# Patient Record
Sex: Male | Born: 1945 | Race: White | Hispanic: No | Marital: Married | Smoking: Never smoker
Health system: Southern US, Community
[De-identification: ages and names within clinical notes are randomized; demographics above are authoritative.]

## PROBLEM LIST (undated history)

## (undated) DIAGNOSIS — I639 Cerebral infarction, unspecified: Secondary | ICD-10-CM

## (undated) DIAGNOSIS — M109 Gout, unspecified: Secondary | ICD-10-CM

## (undated) DIAGNOSIS — E119 Type 2 diabetes mellitus without complications: Secondary | ICD-10-CM

## (undated) DIAGNOSIS — C61 Malignant neoplasm of prostate: Secondary | ICD-10-CM

## (undated) DIAGNOSIS — F32A Depression, unspecified: Secondary | ICD-10-CM

## (undated) DIAGNOSIS — F1010 Alcohol abuse, uncomplicated: Secondary | ICD-10-CM

## (undated) DIAGNOSIS — F329 Major depressive disorder, single episode, unspecified: Secondary | ICD-10-CM

## (undated) DIAGNOSIS — E785 Hyperlipidemia, unspecified: Secondary | ICD-10-CM

## (undated) DIAGNOSIS — G629 Polyneuropathy, unspecified: Secondary | ICD-10-CM

## (undated) DIAGNOSIS — G4730 Sleep apnea, unspecified: Secondary | ICD-10-CM

## (undated) DIAGNOSIS — I10 Essential (primary) hypertension: Secondary | ICD-10-CM

## (undated) DIAGNOSIS — I2510 Atherosclerotic heart disease of native coronary artery without angina pectoris: Secondary | ICD-10-CM

## (undated) DIAGNOSIS — Z8673 Personal history of transient ischemic attack (TIA), and cerebral infarction without residual deficits: Secondary | ICD-10-CM

## (undated) HISTORY — DX: Type 2 diabetes mellitus without complications: E11.9

## (undated) HISTORY — DX: Cerebral infarction, unspecified: I63.9

## (undated) HISTORY — PX: OTHER SURGICAL HISTORY: SHX169

## (undated) HISTORY — PX: CORONARY ANGIOPLASTY: SHX604

## (undated) HISTORY — PX: APPENDECTOMY: SHX54

## (undated) HISTORY — DX: Polyneuropathy, unspecified: G62.9

## (undated) HISTORY — DX: Depression, unspecified: F32.A

## (undated) HISTORY — DX: Hyperlipidemia, unspecified: E78.5

## (undated) HISTORY — DX: Alcohol abuse, uncomplicated: F10.10

## (undated) HISTORY — PX: PROSTATECTOMY: SHX69

## (undated) HISTORY — DX: Personal history of transient ischemic attack (TIA), and cerebral infarction without residual deficits: Z86.73

## (undated) HISTORY — DX: Atherosclerotic heart disease of native coronary artery without angina pectoris: I25.10

## (undated) HISTORY — PX: TONSILLECTOMY: SUR1361

## (undated) HISTORY — DX: Sleep apnea, unspecified: G47.30

## (undated) HISTORY — PX: CHOLECYSTECTOMY: SHX55

## (undated) HISTORY — PX: KNEE SURGERY: SHX244

## (undated) HISTORY — DX: Gout, unspecified: M10.9

## (undated) HISTORY — PX: CARDIAC CATHETERIZATION: SHX172

## (undated) HISTORY — DX: Major depressive disorder, single episode, unspecified: F32.9

## (undated) HISTORY — DX: Essential (primary) hypertension: I10

## (undated) HISTORY — PX: COLONOSCOPY: SHX174

---

## 2013-06-27 DIAGNOSIS — R079 Chest pain, unspecified: Secondary | ICD-10-CM | POA: Insufficient documentation

## 2016-04-12 LAB — HEMOGLOBIN A1C: HEMOGLOBIN A1C: 9.8

## 2016-09-10 ENCOUNTER — Other Ambulatory Visit: Payer: Self-pay | Admitting: Family Medicine

## 2016-09-10 ENCOUNTER — Ambulatory Visit
Admission: RE | Admit: 2016-09-10 | Discharge: 2016-09-10 | Disposition: A | Discharge status: Home / Self Care | Payer: Medicare Other | Source: Ambulatory Visit | Attending: Family Medicine | Admitting: Family Medicine

## 2016-09-10 DIAGNOSIS — R0602 Shortness of breath: Secondary | ICD-10-CM

## 2016-09-28 ENCOUNTER — Telehealth: Payer: Self-pay | Admitting: Cardiology

## 2016-09-28 NOTE — Telephone Encounter (Signed)
Received records from Dr Derinda Late for appointment on 10/07/16 with Dr Percival Spanish.  Records put with Dr Hochrein's schedule for 10/07/16. lp

## 2016-09-29 ENCOUNTER — Encounter: Payer: Self-pay | Admitting: Neurology

## 2016-09-30 ENCOUNTER — Encounter: Payer: Self-pay | Admitting: Neurology

## 2016-09-30 ENCOUNTER — Telehealth: Payer: Self-pay | Admitting: Neurology

## 2016-09-30 ENCOUNTER — Telehealth: Payer: Self-pay

## 2016-09-30 ENCOUNTER — Ambulatory Visit (INDEPENDENT_AMBULATORY_CARE_PROVIDER_SITE_OTHER): Payer: Medicare Other | Admitting: Neurology

## 2016-09-30 VITALS — BP 148/87 | HR 108 | Ht 73.0 in | Wt 226.0 lb

## 2016-09-30 DIAGNOSIS — F332 Major depressive disorder, recurrent severe without psychotic features: Secondary | ICD-10-CM | POA: Diagnosis not present

## 2016-09-30 DIAGNOSIS — G4733 Obstructive sleep apnea (adult) (pediatric): Secondary | ICD-10-CM

## 2016-09-30 DIAGNOSIS — R55 Syncope and collapse: Secondary | ICD-10-CM | POA: Diagnosis not present

## 2016-09-30 DIAGNOSIS — G4719 Other hypersomnia: Secondary | ICD-10-CM

## 2016-09-30 NOTE — Telephone Encounter (Signed)
Attempted  Call to patient because Dr Brett Fairy wanted them to get labs prior to leaving but they left without getting the lab work. Will attempt to call again.

## 2016-09-30 NOTE — Progress Notes (Signed)
SLEEP MEDICINE CLINIC   Provider:  Larey Mays, M D  Primary Care Physician:  Raymond Late, MD   Referring Provider: Derinda Late, MD    Chief Complaint  Patient presents with  . New Patient (Initial Visit)    sleep apnea diagosed in 2015. most recent sleep study was in 2017. pt has used a cpap in the past.     HPI:  Raymond Mays is a 71 y.o. male , seen here as in a referral/ revisit  from Dr. Sandi Mays for alternative treatments of sleep apnea.    Mr. and Mrs. Morren presented to this visit on 09/30/2016, arranged by Dr. Sandi Mays. [name] has undergone multiple sleep studies the last 1 in 2017, he has been seen at [place], and ENT consultation at [facility] did result in that he was not considered an appropriate candidate for the inspire procedure. In my discussion with the patient I found that he is again excessively daytime sleepy, and he remains untreated for a rather high degree of obstructive sleep apnea. His last sleep study indicated an AHI of 64. He was then titrated to CPAP in April 2017 beginning at 4 cm water and achieving the best result at 10 cm water with 2 cm EPR. He did not go into supine REM sleep. There was no central apnea emerging under treatment but it seems that he had significant and frequent periodic limb movements of sleep with an index of 51 per hour under CPAP. He only slept 60.6% of the recorded night. The patient would consider himself a habitual mouth breather but does have nasal patency. It seems that he was highly uncomfortable with using a full face mask, but was not offered any alternative. He has given CPAP a good trial with full face mask for about 3-4 month but then gave up as he could not achieve better sleep than without CPAP treatment.  Dr. Sandi Mays has also mentioned [name] significant history of depression, and there is a hope that depression may benefit when sleep disordered breathing is treated adequately.   He also had  recently episodes of syncope the first was in 2015 by driving and the second in May of this year. On both occasions he described a feeling of tightness in the throat, possibly a laryngospasm. He had 2 other episodes earlier this year of near-syncope, also associated with choking.   Sleep habits are as follows: 'I am in bed all day" . I don't do anything, really. I eat and sleep. His wife confirms he goes again to bed after each meal. And he goes to bed at 8.30PM and feels exhausted. He dreams vividly. Sometimes he will have corresponding movements such as trying to hold on to something he dreams about,  he has not been fighting kicking yelling or boxing in the last 6 months, but has done so previously. He does not leaves the bed, he does not sleep walk. He describes an irresistible urge to go to sleep during the day. When he is not physically active and not mentally stimulated, he will be asleep. He rises for breakfast between 8 and 10 AM, will return back to bed after the meal, he will rise for lunch and will return back to bed after the meal, same at dinner.    Sleep medical history and family sleep history: major depression, the patient just started Wellbutrin on Saturday, and his dose of Effexor was changed. All other medications have remained. The patient just finished a  round of prednisone and valacyclovir in the treatment of herpes zoster, affecting his right thoracic levels 8 through 11.   Social history:  Retired from [employer], [employer] , [employer]. The couple is married, they do have each children from previous marriages, [name] has to Mrs. Murin has 4 children. The patient is not used tobacco products, the patient has not consumed alcohol over the last 3.5 years. Caffeine use about 4 coffee in the morning, all day long ice tea and soda.  Review of Systems: Out of a complete 14 system review, the patient complains of only the following symptoms, and all other reviewed systems are  negative.  snoring, depression , EDS 16-18 hours a day.   Epworth score 16, Fatigue severity score 56  , depression score 10/15   Social History   Social History  . Marital status: Married    Spouse name: N/A  . Number of children: N/A  . Years of education: N/A   Occupational History  . Not on file.   Social History Main Topics  . Smoking status: Never Smoker  . Smokeless tobacco: Never Used  . Alcohol use No  . Drug use: No  . Sexual activity: Not on file   Other Topics Concern  . Not on file   Social History Narrative  . No narrative on file    No family history on file.  Past Medical History:  Diagnosis Date  . Alcohol abuse   . Cancer [facility])    prostate  . Depression   . Diabetes mellitus without complication ([place])   . Heart disease   . Hyperlipemia   . Hypertension   . Neuropathy     Past Surgical History:  Procedure Laterality Date  . CHOLECYSTECTOMY    . COLONOSCOPY    . heart stent    . KNEE SURGERY     bakers cyst  . PROSTATECTOMY    . TONSILLECTOMY      Current Outpatient Prescriptions  Medication Sig Dispense Refill  . allopurinol (ZYLOPRIM) 300 MG tablet Take 300 mg by mouth daily.    . ARIPiprazole (ABILIFY) 5 MG tablet Take 5 mg by mouth daily.    Marland Kitchen aspirin 81 MG tablet Take 81 mg by mouth daily.    Marland Kitchen buPROPion (WELLBUTRIN SR) 150 MG 12 hr tablet Take 150 mg by mouth daily.    . Canagliflozin-Metformin HCl ER (INVOKAMET XR) 150-500 MG TB24 Take 2 tablets by mouth daily.    Marland Kitchen desonide (DESOWEN) 0.05 % cream Apply 1 application topically 2 (two) times daily.    . Exenatide ER 2 MG PEN Inject into the skin.    . fluticasone (VERAMYST) 27.5 MCG/SPRAY nasal spray Place 2 sprays into the nose daily.    . insulin detemir (LEVEMIR) 100 UNIT/ML injection Inject 50 Units into the skin at bedtime.    . insulin lispro (HUMALOG) 100 UNIT/ML injection Inject 10-18 Units into the skin 3 (three) times daily before meals. 10 u before lunch and 18 units  before supper    . losartan (COZAAR) 100 MG tablet Take 50 mg by mouth daily.    Marland Kitchen omeprazole (PRILOSEC) 40 MG capsule Take 40 mg by mouth daily.    . rosuvastatin (CRESTOR) 10 MG tablet Take 10 mg by mouth daily.    Marland Kitchen venlafaxine (EFFEXOR) 75 MG tablet Take 75 mg by mouth 2 (two) times daily with a meal.     No current facility-administered medications for this visit.  Allergies as of 09/30/2016 - Review Complete 09/30/2016  Allergen Reaction Noted  . Percocet [oxycodone-acetaminophen] Itching 09/30/2016    Vitals:  Last Weight:  Wt Readings from Last 1 Encounters:  09/30/16 26 lb (11.8 kg)   PJK:DTOI mass index is 3.43 kg/m.     Last Height:   Ht Readings from Last 1 Encounters:  09/30/16 _0  (1.854 m)    Physical exam:  General: The patient is awake, alert and appears not in acute distress. The patient is well groomed. Head: Normocephalic, atraumatic. Neck is supple. Mallampati 3,  neck circumference:18.5 . Nasal airflow , TMJ is  evident . Retrognathia is not seen.  Cardiovascular:  Regular rate and rhythm, without  murmurs or carotid bruit, and without distended neck veins. Respiratory: Lungs are clear to auscultation. Skin:  Without evidence of edema, or rash Trunk: BMI is . The patient's posture is erect   Neurologic exam : The patient is awake and alert, oriented to place and time.     Attention span & concentration ability appears normal.  Speech is fluent,  without  dysarthria, dysphonia or aphasia.  Mood and affect are appropriate.  Cranial nerves: Pupils are equal and briskly reactive to light. Funduscopic exam without  evidence of pallor or edema. Extraocular movements  in vertical and horizontal planes intact and without nystagmus. Visual fields by finger perimetry are intact.Hearing to finger rub intact. Facial sensation intact to fine touch.Facial motor strength is symmetric and tongue and uvula move midline. Shoulder shrug was symmetrical.   Motor  exam: Normal tone, muscle bulk and symmetric strength in all extremities. Sensory:  Fine touch, pinprick and vibration were tested in all extremities. Proprioception tested in the upper extremities was normal. Coordination: Rapid alternating movements in the fingers/hands was normal. Finger-to-nose maneuver  normal without evidence of ataxia, dysmetria or tremor. Gait and station: Patient walks without assistive device.  Deep tendon reflexes: in the  upper and lower extremities are symmetric and intact. Babinski maneuver response is  downgoing.    Assessment:  After physical and neurologic examination, review of laboratory studies,  Personal review of imaging studies, reports of other /same  Imaging studies, results of polysomnography and / or neurophysiology testing and pre-existing records as far as provided in visit., my assessment is   1) excessive daytime sleepiness with Epworth 16/ 24. Brother has EDS. he has this visit and lucid dreams, dreaming that he cannot move but he tries to wake up. Sleep paralysis. His dreams are frequently about being vessels, being attacked. He does not endorse cataplectic attacks.  2) Mr. Marinaro has a long-standing history of depression, but I'm not sure that this is all that contributes to his excessive daytime sleepiness. I feel that he may have some narcoleptic symptoms.. In addition he has untreated sleep apnea. I will  be able to repeat a sleep study - I will refer him for a mask fitting in advance.Marland Kitchen He also developed a upper respiratory tract infection around Christmas last year and afterwards stopped using the CPAP as he felt that this may have contributed to the infection.  My goal would be for him to undergo a CPAP retitration. I order a narcolepsy blood test, HLA. desensitization. I have not found a neurological explanation for his syncopes.    The patient was advised of the nature of the diagnosed disorder , the treatment options and the  risks for  general health and wellness arising from not treating the condition.   I spent more  than 50 minutes of face to face time with the patient.  Greater than 50% of time was spent in counseling and coordination of care. We have discussed the diagnosis and differential and I answered the patient's questions.    Plan:  Treatment plan and additional workup : I will refer Mr. Cortright to [facility] for a mask fit, followed by a week titration. If CPAP will be hard to tolerate for the patient he may do better on BiPAP. I want him to have a level of comfort before he comes in for the sleep study and I think it would be beneficial for him to have the mask at home that he can wear it on occasion and gets used to it aside from sleeping.    Raymond Seat, MD 4/49/6759, 1:63 AM  Certified in Neurology by ABPN Certified in [place] by [facility] [address] [place], [place] [address]

## 2016-09-30 NOTE — Telephone Encounter (Signed)
Patient came for a mask fitting prior to his sleep study. Fitted him with Res Med N20 medium. Hooked him up to cpap 5 and he tolerated well. He liked this mask and it had a 0 leak.

## 2016-10-01 ENCOUNTER — Ambulatory Visit (INDEPENDENT_AMBULATORY_CARE_PROVIDER_SITE_OTHER): Payer: Medicare Other | Admitting: Sports Medicine

## 2016-10-01 ENCOUNTER — Encounter: Payer: Self-pay | Admitting: Sports Medicine

## 2016-10-01 VITALS — BP 121/84 | HR 98 | Ht 73.0 in | Wt 256.0 lb

## 2016-10-01 DIAGNOSIS — M2042 Other hammer toe(s) (acquired), left foot: Secondary | ICD-10-CM

## 2016-10-01 DIAGNOSIS — L84 Corns and callosities: Secondary | ICD-10-CM | POA: Diagnosis not present

## 2016-10-01 DIAGNOSIS — M2041 Other hammer toe(s) (acquired), right foot: Secondary | ICD-10-CM

## 2016-10-01 DIAGNOSIS — E1142 Type 2 diabetes mellitus with diabetic polyneuropathy: Secondary | ICD-10-CM

## 2016-10-01 DIAGNOSIS — M79675 Pain in left toe(s): Secondary | ICD-10-CM

## 2016-10-01 DIAGNOSIS — M79674 Pain in right toe(s): Secondary | ICD-10-CM | POA: Diagnosis not present

## 2016-10-01 DIAGNOSIS — B351 Tinea unguium: Secondary | ICD-10-CM | POA: Diagnosis not present

## 2016-10-01 NOTE — Progress Notes (Signed)
   Subjective:    Patient ID: Raymond Mays, male    DOB: Jul 05, 1945, 71 y.o.   MRN: [id number]  HPI  I am diabetic and need an exam and my toe nails trimmed.     Review of Systems  Constitutional: Positive for activity change.  HENT: Positive for hearing loss, tinnitus and trouble swallowing.   Respiratory: Positive for shortness of breath and wheezing.   Musculoskeletal: Positive for arthralgias and gait problem.  Neurological: Positive for dizziness, tremors, light-headedness and numbness.       Objective:   Physical Exam        Assessment & Plan:

## 2016-10-01 NOTE — Progress Notes (Signed)
Subjective: Raymond Mays is a 71 y.o. male patient with history of diabetes who presents to office today complaining of long, painful nails and callus  while ambulating in shoes; unable to trim. Patient states that the glucose reading this morning was not recorded. Does not check. However, last A1c was 9 per wife. Patient denies any new changes in medication or new problems. Patient admits cramping, numbness, burning or tingling in the legs that has been present for years, even before the diagnosis of diabetes. Patient states that he was diagnosed with diabetes 3-4 years ago and last saw his primary doctor 1 month ago.  Patient Active Problem List   Diagnosis Date Noted  . Excessive daytime sleepiness 09/30/2016  . Severe episode of recurrent major depressive disorder, without psychotic features ([place]) 09/30/2016  . Syncope, near 09/30/2016  . OSA (obstructive sleep apnea) 09/30/2016   Current Outpatient Prescriptions on File Prior to Visit  Medication Sig Dispense Refill  . allopurinol (ZYLOPRIM) 300 MG tablet Take 300 mg by mouth daily.    . ARIPiprazole (ABILIFY) 5 MG tablet Take 5 mg by mouth daily.    Marland Kitchen aspirin 81 MG tablet Take 81 mg by mouth daily.    Marland Kitchen buPROPion (WELLBUTRIN SR) 150 MG 12 hr tablet Take 150 mg by mouth daily.    . Canagliflozin-Metformin HCl ER (INVOKAMET XR) 150-500 MG TB24 Take 2 tablets by mouth daily.    Marland Kitchen desonide (DESOWEN) 0.05 % cream Apply 1 application topically 2 (two) times daily.    . Exenatide ER 2 MG PEN Inject into the skin.    . fluticasone (VERAMYST) 27.5 MCG/SPRAY nasal spray Place 2 sprays into the nose daily.    . insulin detemir (LEVEMIR) 100 UNIT/ML injection Inject 50 Units into the skin at bedtime.    . insulin lispro (HUMALOG) 100 UNIT/ML injection Inject 10-18 Units into the skin 3 (three) times daily before meals. 10 u before lunch and 18 units before supper    . losartan (COZAAR) 100 MG tablet Take 50 mg by mouth daily.    Marland Kitchen omeprazole  (PRILOSEC) 40 MG capsule Take 40 mg by mouth daily.    . rosuvastatin (CRESTOR) 10 MG tablet Take 10 mg by mouth daily.    Marland Kitchen venlafaxine (EFFEXOR) 75 MG tablet Take 75 mg by mouth 2 (two) times daily with a meal.     No current facility-administered medications on file prior to visit.    Allergies  Allergen Reactions  . Percocet [Oxycodone-Acetaminophen] Itching    No results found for this or any previous visit (from the past 2160 hour(s)).  Objective: General: Patient is awake, alert, and oriented x 3 and in no acute distress.  Integument: Skin is warm, dry and supple bilateral. Nails are tender, long, thickened and  dystrophic with subungual debris, consistent with onychomycosis, 1-5 on right and 2-5 on left. Patient has a history of previous nail removal on left great toe. No signs of infection. No open lesions. There is a interdigital corn/callus at the medial aspect of the left fifth toe with no surrounding signs of infection.. Remaining integument unremarkable.  Vasculature:  Dorsalis Pedis pulse 1/4 bilateral. Posterior Tibial pulse 1 /4 bilateral.  Capillary fill time <3 sec 1-5 bilateral. Scant hair growth to the level of the digits. Temperature gradient within normal limits. Mild varicosities present bilateral. No edema present bilateral.   Neurology: The patient has absent sensation measured with a 5.07/10g Semmes Weinstein Monofilament at toes bilateral . Vibratory sensation diminished  bilateral with tuning fork. No Babinski sign present bilateral.   Musculoskeletal: Asymptomatic hammertoe pedal deformities noted bilateral. Muscular strength 5/5 in all lower extremity muscular groups bilateral without pain on range of motion. No tenderness with calf compression bilateral.  Assessment and Plan: Problem List Items Addressed This Visit    None    Visit Diagnoses    Pain due to onychomycosis of toenails of both feet    -  Primary   Diabetic polyneuropathy associated with  type 2 diabetes mellitus (HCC)       Hammer toes of both feet       Foot callus       Left fifth toe, medial aspect   Toe pain, left          -Examined patient. -Discussed and educated patient on diabetic foot care, especially with  regards to the vascular, neurological and musculoskeletal systems.  -Stressed the importance of good glycemic control and the detriment of not  controlling glucose levels in relation to the foot. -Mechanically debridedCallus at left fifth toe using a sterile tissue nipper without incident and debrided mechanically all nails 1-5 on right, 2-5 on left using sterile nail nipper and filed with dremel without incident  -Answered all patient questions -Patient to return  in 3 months for at risk foot care -Patient advised to call the office if any problems or questions arise in the meantime.  Landis Martins, DPM

## 2016-10-01 NOTE — Patient Instructions (Signed)

## 2016-10-06 NOTE — Progress Notes (Signed)
Cardiology Office Note   Date:  10/08/2016   ID:  Raymond Mays, DOB February 22, 1945, MRN [id number]  PCP:  Derinda Late, MD  Cardiologist:   Minus Breeding, MD  Referring:  Derinda Late, MD    Chief Complaint  Patient presents with  . Coronary Artery Disease    NP      History of Present Illness: Raymond Mays is a 71 y.o. male who is referred by Derinda Late, MD for evaluation of CAD.  He has a history of stent placement to the circumflex in 2007.     He was treated in [facility]. Last catheterization was 2015. At that time he had 30% proximal diffuse LAD stenosis, patent stent in circumflex, 50% obtuse marginal stenosis.     He presents for follow-up to establish with a cardiologist. However, he does have significant fatigue. He has apparently severe sleep apnea and has been unable to wear CPAP although he's going to be working on this and getting another study. He's had shortness of breath with activity. This has been going on for a year. He thinks it's relatively stable although it may be getting slightly worse. He short of breath doing activities such as taking the garbage cans to occur. He's not describing PND or orthopnea. He's not had any of the chest discomfort that he had prior to his stent. He denies any palpitations, presyncope or syncope. He has no new edema.  He has had syncope a couple occasions related to eating and having a choking episode.  He otherwise has not had an episode of this.    Past Medical History:  Diagnosis Date  . Alcohol abuse   . CAD (coronary artery disease)    Stent to circ 2007, patent on cath 2015.  NL EF  . Cancer [facility])    prostate  . Depression   . Diabetes mellitus without complication ([place])   . Hyperlipemia   . Hypertension   . Neuropathy   . Sleep apnea     Past Surgical History:  Procedure Laterality Date  . CHOLECYSTECTOMY    . COLONOSCOPY    . heart stent    . KNEE SURGERY     bakers cyst  .  PROSTATECTOMY    . TONSILLECTOMY       Current Outpatient Prescriptions  Medication Sig Dispense Refill  . allopurinol (ZYLOPRIM) 300 MG tablet Take 300 mg by mouth daily.    . ARIPiprazole (ABILIFY) 5 MG tablet Take 5 mg by mouth daily.    Marland Kitchen aspirin 81 MG tablet Take 81 mg by mouth daily.    Marland Kitchen buPROPion (WELLBUTRIN SR) 150 MG 12 hr tablet Take 150 mg by mouth daily.    . Canagliflozin-Metformin HCl ER (INVOKAMET XR) 150-500 MG TB24 Take 2 tablets by mouth daily.    Marland Kitchen desonide (DESOWEN) 0.05 % cream Apply 1 application topically 2 (two) times daily.    . Exenatide ER 2 MG PEN Inject into the skin.    Marland Kitchen insulin detemir (LEVEMIR) 100 UNIT/ML injection Inject 50 Units into the skin at bedtime.    . insulin lispro (HUMALOG) 100 UNIT/ML injection Inject 10-18 Units into the skin 3 (three) times daily before meals. 10 u before lunch and 18 units before supper    . losartan (COZAAR) 100 MG tablet Take 50 mg by mouth daily.    Marland Kitchen omeprazole (PRILOSEC) 40 MG capsule Take 40 mg by mouth daily.    . rosuvastatin (CRESTOR) 10 MG tablet  Take 10 mg by mouth daily.    Marland Kitchen venlafaxine (EFFEXOR) 75 MG tablet Take 75 mg by mouth 2 (two) times daily with a meal.     No current facility-administered medications for this visit.     Allergies:   Percocet [oxycodone-acetaminophen]    Social History:  The patient  reports that he has never smoked. He has never used smokeless tobacco. He reports that he does not drink alcohol or use drugs.   Family History:  The patient's family history includes Diabetes in his father; Heart attack in his mother; Prostate cancer in his father.    ROS:  Please see the history of present illness.   Otherwise, review of systems are positive for depression.   All other systems are reviewed and negative.    PHYSICAL EXAM: VS:  BP 132/86   Pulse (!) 101   Ht 6\' 1"  (1.854 m)   Wt 258 lb (117 kg)   BMI 34.04 kg/m  , BMI Body mass index is 34.04 kg/m. GENERAL:  Well  appearing HEENT:  Pupils equal round and reactive, fundi not visualized, oral mucosa unremarkable NECK:  No jugular venous distention, waveform within normal limits, carotid upstroke brisk and symmetric, no bruits, no thyromegaly LYMPHATICS:  No cervical, inguinal adenopathy LUNGS:  Clear to auscultation bilaterally BACK:  No CVA tenderness CHEST:  Unremarkable HEART:  PMI not displaced or sustained,S1 and S2 within normal limits, no S3, no S4, no clicks, no rubs, no murmurs ABD:  Flat, positive bowel sounds normal in frequency in pitch, no bruits, no rebound, no guarding, no midline pulsatile mass, no hepatomegaly, no splenomegaly EXT:  2 plus pulses throughout, no edema, no cyanosis no clubbing SKIN:  No rashes no nodules NEURO:  Cranial nerves II through XII grossly intact, motor grossly intact throughout PSYCH:  Cognitively intact, oriented to person place and time    EKG:  EKG is ordered today. The ekg ordered today demonstrates sinus tachycardia, rate 101, axis within normal limits, intervals within normal limits, poor anterior R wave progression, can't exclude old anteroseptal infarct   Recent Labs: No results found for requested labs within last 8760 hours.    Lipid Panel No results found for: CHOL, TRIG, HDL, CHOLHDL, VLDL, LDLCALC, LDLDIRECT    Wt Readings from Last 3 Encounters:  10/07/16 258 lb (117 kg)  10/01/16 256 lb (116.1 kg)  09/30/16 226 lb (102.5 kg)      Other studies Reviewed: Additional studies/ records that were reviewed today include: Extensive office records. Review of the above records demonstrates:  Please see elsewhere in the note.     ASSESSMENT AND PLAN:  CAD:   Dyspnea could be an anginal equivalent. He needs stress testing. He would not be a walk on a treadmill. Therefore, he will have a [employer].  I am going to check a BNP.   SLEEP APNEA:   We had a long talk about the importance of following up with treatment of this and hopefully  this will be successful.  DM:  A1c was 10.6 in November. He has been referred to endocrinology.  We talked about the need for good BS control.   DYSLIPIDEMIA:    LDL was 44, HDL 50 in July of last year. He'll remain on the meds as listed.  Current medicines are reviewed at length with the patient today.  The patient does not have concerns regarding medicines.  The following changes have been made:  no change  Labs/ tests ordered  today include:   Orders Placed This Encounter  Procedures  . B Nat Peptide  . Myocardial Perfusion Imaging  . EKG 12-Lead     Disposition:   FU with 3 months.      Signed, Minus Breeding, MD  10/08/2016 8:08 AM    [facility]

## 2016-10-06 NOTE — Telephone Encounter (Signed)
Called the patient's wife and the patient is planning to go to cardiologist tomorrow and [facility] is [facility] there at that office. I sent the [facility] to the [facility] that she mentioned so that they will have the order when the patient walks in tomorrow.

## 2016-10-06 NOTE — Telephone Encounter (Signed)
Pt wife asking for a call back re: pt being advised to go to a [facility] location.  Pt wife would like to know of nearest [facility] since they are in [place] and if orders will need to be sent there.  Please call

## 2016-10-06 NOTE — Telephone Encounter (Signed)
The patient does not have to fast for the lab test.

## 2016-10-06 NOTE — Telephone Encounter (Signed)
Patient's wife calling to find out if patient has to fast for lab tests.

## 2016-10-07 ENCOUNTER — Encounter: Payer: Self-pay | Admitting: Cardiology

## 2016-10-07 ENCOUNTER — Ambulatory Visit (INDEPENDENT_AMBULATORY_CARE_PROVIDER_SITE_OTHER): Payer: Medicare Other | Admitting: Cardiology

## 2016-10-07 VITALS — BP 132/86 | HR 101 | Ht 73.0 in | Wt 258.0 lb

## 2016-10-07 DIAGNOSIS — I2510 Atherosclerotic heart disease of native coronary artery without angina pectoris: Secondary | ICD-10-CM | POA: Diagnosis not present

## 2016-10-07 DIAGNOSIS — E118 Type 2 diabetes mellitus with unspecified complications: Secondary | ICD-10-CM

## 2016-10-07 DIAGNOSIS — G4730 Sleep apnea, unspecified: Secondary | ICD-10-CM

## 2016-10-07 DIAGNOSIS — R0602 Shortness of breath: Secondary | ICD-10-CM | POA: Diagnosis not present

## 2016-10-07 DIAGNOSIS — Z794 Long term (current) use of insulin: Secondary | ICD-10-CM | POA: Diagnosis not present

## 2016-10-07 NOTE — Patient Instructions (Signed)
Medication Instructions:  Continue current medications  If you need a refill on your cardiac medications before your next appointment, please call your pharmacy.  Labwork: BNP HERE IN OUR OFFICE AT [facility]  Testing/Procedures: Your physician has requested that you have a lexiscan myoview. For further information please visit [email or web address]. Please follow instruction sheet, as given.   Follow-Up: Your physician wants you to follow-up in: 3 Months.    Thank you for choosing [facility] at [facility]!!

## 2016-10-08 ENCOUNTER — Encounter: Payer: Self-pay | Admitting: Cardiology

## 2016-10-08 DIAGNOSIS — R0602 Shortness of breath: Secondary | ICD-10-CM | POA: Insufficient documentation

## 2016-10-08 DIAGNOSIS — E118 Type 2 diabetes mellitus with unspecified complications: Secondary | ICD-10-CM | POA: Insufficient documentation

## 2016-10-08 DIAGNOSIS — I2510 Atherosclerotic heart disease of native coronary artery without angina pectoris: Secondary | ICD-10-CM | POA: Insufficient documentation

## 2016-10-08 DIAGNOSIS — Z794 Long term (current) use of insulin: Secondary | ICD-10-CM

## 2016-10-08 LAB — BRAIN NATRIURETIC PEPTIDE: Brain Natriuretic Peptide: 7 pg/mL (ref <100)

## 2016-10-11 ENCOUNTER — Ambulatory Visit (INDEPENDENT_AMBULATORY_CARE_PROVIDER_SITE_OTHER): Payer: Medicare Other | Admitting: Neurology

## 2016-10-11 DIAGNOSIS — R55 Syncope and collapse: Secondary | ICD-10-CM

## 2016-10-11 DIAGNOSIS — G4719 Other hypersomnia: Secondary | ICD-10-CM

## 2016-10-11 DIAGNOSIS — F332 Major depressive disorder, recurrent severe without psychotic features: Secondary | ICD-10-CM

## 2016-10-11 DIAGNOSIS — G4733 Obstructive sleep apnea (adult) (pediatric): Secondary | ICD-10-CM | POA: Diagnosis not present
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
## 2016-10-13 ENCOUNTER — Institutional Professional Consult (permissible substitution): Payer: Self-pay | Admitting: Neurology

## 2016-10-13 ENCOUNTER — Telehealth: Payer: Self-pay | Admitting: Neurology

## 2016-10-13 ENCOUNTER — Other Ambulatory Visit: Payer: Self-pay | Admitting: Neurology

## 2016-10-13 DIAGNOSIS — G4733 Obstructive sleep apnea (adult) (pediatric): Secondary | ICD-10-CM

## 2016-10-13 NOTE — Telephone Encounter (Signed)
Called and spoke with the patient's wife. I went over the sleep study results and explained that the patient would need to come in for a Bipap titration study. Pt wife verbalized understanding. They will await the call from the sleep lab to get that set up

## 2016-10-13 NOTE — Telephone Encounter (Signed)
-----   Message from Larey Seat, MD sent at 10/12/2016  5:24 PM EDT ----- This patients severe OSA was confirmed in this sleep study. A strong accentuation is in supine position- keeping the patient from sleeping on his back is the key. He could tolerate CPAP but developed higher AH indeces on higher pressures. BiPAP was only tried for 2 settings and will need to be repeated- NO FFM ( full face mask ), please.   Cc Blomgreen, Peter.

## 2016-10-19 ENCOUNTER — Telehealth (HOSPITAL_COMMUNITY): Payer: Self-pay | Admitting: *Deleted

## 2016-10-19 NOTE — Telephone Encounter (Signed)
Close encounter 

## 2016-10-20 ENCOUNTER — Telehealth: Payer: Self-pay | Admitting: Neurology

## 2016-10-20 ENCOUNTER — Ambulatory Visit (HOSPITAL_COMMUNITY)
Admission: RE | Admit: 2016-10-20 | Discharge: 2016-10-20 | Disposition: A | Discharge status: Home / Self Care | Payer: Medicare Other | Source: Ambulatory Visit | Attending: Cardiovascular Disease | Admitting: Cardiovascular Disease

## 2016-10-20 DIAGNOSIS — R5383 Other fatigue: Secondary | ICD-10-CM | POA: Diagnosis not present

## 2016-10-20 DIAGNOSIS — Z8249 Family history of ischemic heart disease and other diseases of the circulatory system: Secondary | ICD-10-CM | POA: Diagnosis not present

## 2016-10-20 DIAGNOSIS — E119 Type 2 diabetes mellitus without complications: Secondary | ICD-10-CM | POA: Insufficient documentation

## 2016-10-20 DIAGNOSIS — I10 Essential (primary) hypertension: Secondary | ICD-10-CM | POA: Insufficient documentation

## 2016-10-20 DIAGNOSIS — R0602 Shortness of breath: Secondary | ICD-10-CM

## 2016-10-20 DIAGNOSIS — G4733 Obstructive sleep apnea (adult) (pediatric): Secondary | ICD-10-CM | POA: Diagnosis not present

## 2016-10-20 DIAGNOSIS — R0609 Other forms of dyspnea: Secondary | ICD-10-CM | POA: Insufficient documentation

## 2016-10-20 DIAGNOSIS — E669 Obesity, unspecified: Secondary | ICD-10-CM | POA: Diagnosis not present

## 2016-10-20 DIAGNOSIS — Z6834 Body mass index (BMI) 34.0-34.9, adult: Secondary | ICD-10-CM | POA: Diagnosis not present

## 2016-10-20 DIAGNOSIS — I2510 Atherosclerotic heart disease of native coronary artery without angina pectoris: Secondary | ICD-10-CM | POA: Diagnosis not present

## 2016-10-20 LAB — MYOCARDIAL PERFUSION IMAGING
CHL CUP NUCLEAR SRS: 1
CSEPPHR: 107 {beats}/min
LV dias vol: 80 mL (ref 62–150)
LV sys vol: 37 mL
Rest HR: 93 {beats}/min
SDS: 1
SSS: 2
TID: 1.13

## 2016-10-20 MED ORDER — TECHNETIUM TC 99M TETROFOSMIN IV KIT
10.6000 | PACK | Freq: Once | INTRAVENOUS | Status: AC | PRN
  Administered 2016-10-20: 10.6 via INTRAVENOUS
  Filled 2016-10-20: qty 11

## 2016-10-20 MED ORDER — REGADENOSON 0.4 MG/5ML IV SOLN
0.4000 mg | Freq: Once | INTRAVENOUS | Status: AC
  Administered 2016-10-20: 0.4 mg via INTRAVENOUS

## 2016-10-20 MED ORDER — TECHNETIUM TC 99M TETROFOSMIN IV KIT
30.3000 | PACK | Freq: Once | INTRAVENOUS | Status: AC | PRN
  Administered 2016-10-20: 30.3 via INTRAVENOUS
  Filled 2016-10-20: qty 31

## 2016-10-20 NOTE — Telephone Encounter (Signed)
Called the patient and the wife answered and I was able to go over the HLA results for the patient.  They were negative.  

## 2016-10-27 ENCOUNTER — Encounter: Payer: Self-pay | Admitting: *Deleted

## 2016-11-10 ENCOUNTER — Ambulatory Visit (INDEPENDENT_AMBULATORY_CARE_PROVIDER_SITE_OTHER): Payer: Medicare Other | Admitting: Endocrinology

## 2016-11-10 ENCOUNTER — Encounter: Payer: Self-pay | Admitting: Endocrinology

## 2016-11-10 VITALS — BP 130/80 | HR 114 | Ht 73.0 in | Wt 257.2 lb

## 2016-11-10 DIAGNOSIS — Z794 Long term (current) use of insulin: Secondary | ICD-10-CM

## 2016-11-10 DIAGNOSIS — E782 Mixed hyperlipidemia: Secondary | ICD-10-CM

## 2016-11-10 DIAGNOSIS — E1165 Type 2 diabetes mellitus with hyperglycemia: Secondary | ICD-10-CM | POA: Diagnosis not present

## 2016-11-10 DIAGNOSIS — E1142 Type 2 diabetes mellitus with diabetic polyneuropathy: Secondary | ICD-10-CM | POA: Diagnosis not present

## 2016-11-10 DIAGNOSIS — Z23 Encounter for immunization: Secondary | ICD-10-CM | POA: Diagnosis not present

## 2016-11-10 DIAGNOSIS — I10 Essential (primary) hypertension: Secondary | ICD-10-CM

## 2016-11-10 LAB — COMPREHENSIVE METABOLIC PANEL
ALT: 28 U/L (ref 0–53)
AST: 16 U/L (ref 0–37)
Albumin: 4.7 g/dL (ref 3.5–5.2)
Alkaline Phosphatase: 83 U/L (ref 39–117)
BILIRUBIN TOTAL: 1.1 mg/dL (ref 0.2–1.2)
BUN: 22 mg/dL (ref 6–23)
CO2: 29 meq/L (ref 19–32)
Calcium: 10.4 mg/dL (ref 8.4–10.5)
Chloride: 103 mEq/L (ref 96–112)
Creatinine, Ser: 1.09 mg/dL (ref 0.40–1.50)
GFR: 70.95 mL/min (ref >60.00)
GLUCOSE: 137 mg/dL — AB (ref 70–99)
Potassium: 4.5 mEq/L (ref 3.5–5.1)
SODIUM: 140 meq/L (ref 135–145)
Total Protein: 7.7 g/dL (ref 6.0–8.3)

## 2016-11-10 LAB — MICROALBUMIN / CREATININE URINE RATIO
Creatinine,U: 266.1 mg/dL
Microalb Creat Ratio: 7.2 mg/g (ref 0.0–30.0)
Microalb, Ur: 19.2 mg/dL — ABNORMAL HIGH (ref 0.0–1.9)

## 2016-11-10 LAB — LIPID PANEL
CHOL/HDL RATIO: 4
CHOLESTEROL: 208 mg/dL — AB (ref 0–200)
HDL: 46.7 mg/dL (ref >39.00)
NonHDL: 161.55
Triglycerides: 206 mg/dL — ABNORMAL HIGH (ref 0.0–149.0)
VLDL: 41.2 mg/dL — AB (ref 0.0–40.0)

## 2016-11-10 LAB — POCT GLYCOSYLATED HEMOGLOBIN (HGB A1C): HEMOGLOBIN A1C: 10.2

## 2016-11-10 LAB — LDL CHOLESTEROL, DIRECT: Direct LDL: 133 mg/dL

## 2016-11-10 NOTE — Patient Instructions (Addendum)
Start checking blood sugars as follows:  Check blood sugars on waking up  at least every other day  Also check blood sugars about 2-3 hours after a meal and do this after different meals by rotation  Recommended blood sugar levels on waking up is 90-130 and about 2 hours after meal is 130-160  Please bring your blood sugar diary to each visit, thank you  TRESIBA: Increase the dose to 54 units  Start taking 18 units of insulin with BREAKFAST also and continue previous doses  May sure you have a little protein with breakfast such as an egg, low-fat cheese or yogurt or low-fat meat  Check with the [employer] about starting an exercise program such as water exercises or recumbent bike

## 2016-11-10 NOTE — Progress Notes (Signed)
Patient ID: Raymond Mays, male   DOB: 08/02/1945, 71 y.o.   MRN: [id number]          Reason for Appointment: Consultation for Type 2 Diabetes  Referring physician: Blomgren   History of Present Illness:          Date of diagnosis of type 2 diabetes mellitus:  2015      Background history:   The patient is a poor historian and is not able to give chronology of his diabetes diagnosis and treatment Records from PCP indicate that he was diagnosed in 2015 but previous treatment is unknown He was apparently taking Actos, Invokamet and Bydureon in 05/2015 when his endocrinologist started him on insulin with a glucose of 326 Subsequently A1c has ranged between 9-10 0.7  Recent history:   INSULIN regimen is: Tresiba 50 units qd. Humalog 18 at lunch -20 at dinner    Non-insulin hypoglycemic drugs the patient is taking are:Invokamet XR, Bydureon  Current management, blood sugar patterns and problems identified:  Patient is checking his blood sugar with a [employer] brand meter and is keeping records however has only a few readings in the last couple of weeks  His blood sugars are mostly high with only occasional good readings in the morning, one or 2 good readings at suppertime and rarely normal at bedtime  He does not think he is excessively thirsty or urinating frequently but does complain of feeling fatigued  No recent weight change  He is trying to take his insulin for meals before lunch and dinner but does not do it at breakfast  Occasionally feeling out he will not take his insulin at dinnertime  No side effects from Bydureon or Invokana  He was taking apparently Levemir until a couple of days ago and this was changed to [place]         Side effects from medications have been: None  Compliance with the medical regimen: Fair Hypoglycemia:   none  Glucose monitoring:  done up to 3 times a day         Glucometer: Relion       Blood Glucose readings by time of day and averages  from home record   PREMEAL Breakfast Lunch Dinner Bedtime  Overall   Glucose range: 135-224 198-305 118-319 106-260   Median:        Self-care: The diet that the patient has been following is: tries to limit Fatty foods, occasionally will have regular soft drinks.      Typical meal intake: Breakfast is cereal              Dietician visit, most recent: At diagnosis               Exercise: walks some, limited by knee pain   Weight history: same  Wt Readings from Last 3 Encounters:  11/10/16 257 lb 3.2 oz (116.7 kg)  10/20/16 258 lb (117 kg)  10/07/16 258 lb (117 kg)    Glycemic control:      Lab Results  Component Value Date   HGBA1C 10.2 11/10/2016   HGBA1C 9.8 04/12/2016   No results found for: GLUF, MICROALBUR, LDLCALC, CREATININE No results found for: MICRALBCREAT  No results found for: FRUCTOSAMINE    Allergies as of 11/10/2016      Reactions   Percocet [oxycodone-acetaminophen] Itching      Medication List       Accurate as of 11/10/16  3:23 PM. Always use your most recent med list.  allopurinol 300 MG tablet Commonly known as:  ZYLOPRIM Take 300 mg by mouth daily.   ARIPiprazole 5 MG tablet Commonly known as:  ABILIFY Take 5 mg by mouth daily.   aspirin 81 MG tablet Take 81 mg by mouth daily.   buPROPion 150 MG 12 hr tablet Commonly known as:  WELLBUTRIN SR Take 300 mg by mouth daily.   desonide 0.05 % cream Commonly known as:  DESOWEN Apply 1 application topically 2 (two) times daily.   Exenatide ER 2 MG Pen Inject into the skin.   insulin lispro 100 UNIT/ML injection Commonly known as:  HUMALOG Inject 18-20 Units into the skin 3 (three) times daily before meals. 18 u before lunch and 20 units before supper   INVOKAMET XR 150-500 MG Tb24 Generic drug:  Canagliflozin-Metformin HCl ER Take 2 tablets by mouth daily.   losartan 100 MG tablet Commonly known as:  COZAAR Take 50 mg by mouth daily.   omeprazole 40 MG  capsule Commonly known as:  PRILOSEC Take 20 mg by mouth 2 (two) times daily.   rosuvastatin 10 MG tablet Commonly known as:  CRESTOR Take 10 mg by mouth daily.   TRESIBA FLEXTOUCH 200 UNIT/ML Sopn Generic drug:  Insulin Degludec Tresiba FlexTouch U-200 insulin 200 unit/mL (3 mL) subcutaneous pen  Inject 50 units every day by subcutaneous route.   venlafaxine 75 MG tablet Commonly known as:  EFFEXOR Take 75 mg by mouth 2 (two) times daily with a meal.       Allergies:  Allergies  Allergen Reactions  . Percocet [Oxycodone-Acetaminophen] Itching    Past Medical History:  Diagnosis Date  . Alcohol abuse   . CAD (coronary artery disease)    Stent to circ 2007, patent on cath 2015.  NL EF  . Cancer [facility])    prostate  . Depression   . Diabetes mellitus without complication ([place])   . Hyperlipemia   . Hypertension   . Neuropathy   . Sleep apnea     Past Surgical History:  Procedure Laterality Date  . CHOLECYSTECTOMY    . COLONOSCOPY    . heart stent    . KNEE SURGERY     bakers cyst  . PROSTATECTOMY    . TONSILLECTOMY      Family History  Problem Relation Age of Onset  . Heart attack Mother        Died 80s  . Prostate cancer Father   . Diabetes Father     Social History:  reports that he has never smoked. He has never used smokeless tobacco. He reports that he does not drink alcohol or use drugs.   Review of Systems  Constitutional: Negative for weight loss and reduced appetite.  HENT: Negative for trouble swallowing.   Eyes: Negative for blurred vision.  Respiratory: Positive for daytime sleepiness.        Has had sleep apnea recently diagnosed and pending treatment  Cardiovascular: Negative for chest pain and leg swelling.  Gastrointestinal: Negative for diarrhea and abdominal pain.  Endocrine: Positive for fatigue. Negative for polydipsia.  Genitourinary: Negative for frequency.       Has nocturia about once  Musculoskeletal: Positive for joint  pain.  Skin:       No history of ulcers on his feet  Neurological: Positive for numbness.       Has had numbness for years in his feet  Psychiatric/Behavioral: Positive for depressed mood.Negative for insomnia.   Lipid history: LDL particle number in  2017 was 720 He has been on Crestor 10 mg daily Has previous history of CAD   No results found for: CHOL, HDL, LDLCALC, LDLDIRECT, TRIG, CHOLHDL         Hypertension: Is being treated with losartan  Most recent eye exam was 1 year ago, reportedly no retinopathy  Most recent foot exam: 10/18 He does see a podiatrist periodically    LABS:  Office Visit on 11/10/2016  Component Date Value Ref Range Status  . Hemoglobin A1C 11/10/2016 10.2   Final  . Hemoglobin A1C 04/12/2016 9.8   Final    Physical Examination:  BP 130/80   Pulse (!) 114   Ht 6\' 1"  (1.854 m)   Wt 257 lb 3.2 oz (116.7 kg)   SpO2 95%   BMI 33.93 kg/m   GENERAL:         Patient has generalized obesity.   HEENT:         Eye exam shows normal external appearance. Fundus exam shows no retinopathy.  Oral exam shows normal mucosa .  NECK:   There is no lymphadenopathy Thyroid is not enlarged and no nodules felt.  Carotids are normal to palpation and no bruit heard LUNGS:         Chest is symmetrical. Lungs are clear to auscultation.Marland Kitchen   HEART:         Heart sounds:  S1 and S2 are normal. No murmur or click heard., no S3 or S4.   ABDOMEN:   There is no distention present. Liver and spleen are not palpable. No other mass or tenderness present.   NEUROLOGICAL:   Ankle jerks and biceps reflexes are absent bilaterally.    Diabetic Foot Exam - Simple   Simple Foot Form Diabetic Foot exam was performed with the following findings:  Yes   Visual Inspection No deformities, no ulcerations, no other skin breakdown bilaterally:  Yes See comments:  Yes Sensation Testing See comments:  Yes Pulse Check Posterior Tibialis and Dorsalis pulse intact bilaterally:   Yes Comments Absent monofilament sensation in the feet Callus formation on the right great toe laterally and some on the left great toe inferiorly           MUSCULOSKELETAL:  There is no swelling or deformity of the peripheral joints. Spine is normal to inspection.   EXTREMITIES:     There is no edema. No skin lesions present.Marland Kitchen SKIN:       No rash or lesions of concern.        ASSESSMENT:  Diabetes type 2, uncontrolled with obesity  He has had fairly significant hyperglycemia with persistently poor control despite taking Invokamet, Bydureon and basal bolus insulin Although his diet is not poor he not able to lose weight Also has limited ability to exercise Continues to be insulin resistant and unable to lose weight despite using Bydureon and Invokana  With his current regimen of insulin he still has significant high blood sugars throughout the day with only occasionally good readings Also not clear why his blood sugars in April/early September were much higher in the 329-518 range  Complications of diabetes:Severe peripheral neuropathy with sensory loss, not clear if this is the only causative factor for neuropathy Unknown status of retinopathy, nephropathy  Has history of coronary artery disease, sleep apnea, osteoarthritis, hyperlipidemia and hypertension    PLAN:     He will start taking Humalog at breakfast also which he is not covering  He will increase his [place] up to  54 units for now  Will need to reassess his blood sugars on his next visit with more consistent monitoring and discussed timing and targets of various blood sugars  Will check his fructosamine to review his recent control  He may have more efficient effect of insulin with using an infusion pump and showed him the Omnipod pump.  This was also make sure he has better compliance with his mealtime insulin   Discussed how this would be used and given him a sample to try at home  He was asked to call the  manufacturer to verify the insurance coverage and then we will set up the training  Will need to check his renal function and potassium since he is on Invokana and losartan with a prior history of hyperkalemia  Encouraged him to start an exercise program at the [facility] or another health club since he cannot walk with his knee pain   Consultation with dietitian for meal planning  Patient Instructions  Start checking blood sugars as follows:  Check blood sugars on waking up  at least every other day  Also check blood sugars about 2-3 hours after a meal and do this after different meals by rotation  Recommended blood sugar levels on waking up is 90-130 and about 2 hours after meal is 130-160  Please bring your blood sugar diary to each visit, thank you  TRESIBA: Increase the dose to 54 units  Start taking 18 units of insulin with BREAKFAST also and continue previous doses  May sure you have a little protein with breakfast such as an egg, low-fat cheese or yogurt or low-fat meat  Check with the [employer] about starting an exercise program such as water exercises or recumbent bike       Consultation note has been sent to the referring physician  Counseling time on subjects discussed in assessment and plan sections is over 50% of today's 60 minute visit   Rula Keniston 11/10/2016, 3:23 PM   Note: This office note was prepared with Dragon voice recognition system technology. Any transcriptional errors that result from this process are unintentional.

## 2016-11-11 ENCOUNTER — Telehealth: Payer: Self-pay | Admitting: Neurology

## 2016-11-11 ENCOUNTER — Ambulatory Visit (INDEPENDENT_AMBULATORY_CARE_PROVIDER_SITE_OTHER): Payer: Medicare Other | Admitting: Neurology

## 2016-11-11 DIAGNOSIS — G4733 Obstructive sleep apnea (adult) (pediatric): Secondary | ICD-10-CM
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
## 2016-11-12 NOTE — Telephone Encounter (Signed)
Made Dr Dohmeier aware of this information.

## 2016-11-15 ENCOUNTER — Encounter (HOSPITAL_COMMUNITY): Payer: Self-pay | Admitting: Emergency Medicine

## 2016-11-15 ENCOUNTER — Observation Stay (HOSPITAL_COMMUNITY)
Admission: EM | Admit: 2016-11-15 | Discharge: 2016-11-17 | Disposition: A | Discharge status: Home / Self Care | Payer: Medicare Other | Attending: Internal Medicine | Admitting: Internal Medicine

## 2016-11-15 ENCOUNTER — Emergency Department (HOSPITAL_COMMUNITY): Payer: Medicare Other

## 2016-11-15 DIAGNOSIS — E119 Type 2 diabetes mellitus without complications: Secondary | ICD-10-CM | POA: Insufficient documentation

## 2016-11-15 DIAGNOSIS — C61 Malignant neoplasm of prostate: Secondary | ICD-10-CM | POA: Insufficient documentation

## 2016-11-15 DIAGNOSIS — Z888 Allergy status to other drugs, medicaments and biological substances status: Secondary | ICD-10-CM | POA: Diagnosis not present

## 2016-11-15 DIAGNOSIS — H532 Diplopia: Secondary | ICD-10-CM

## 2016-11-15 DIAGNOSIS — E785 Hyperlipidemia, unspecified: Secondary | ICD-10-CM | POA: Diagnosis not present

## 2016-11-15 DIAGNOSIS — I2510 Atherosclerotic heart disease of native coronary artery without angina pectoris: Secondary | ICD-10-CM | POA: Diagnosis not present

## 2016-11-15 DIAGNOSIS — Z794 Long term (current) use of insulin: Secondary | ICD-10-CM | POA: Diagnosis not present

## 2016-11-15 DIAGNOSIS — I10 Essential (primary) hypertension: Secondary | ICD-10-CM | POA: Diagnosis not present

## 2016-11-15 DIAGNOSIS — F329 Major depressive disorder, single episode, unspecified: Secondary | ICD-10-CM | POA: Diagnosis not present

## 2016-11-15 DIAGNOSIS — Z7982 Long term (current) use of aspirin: Secondary | ICD-10-CM | POA: Insufficient documentation

## 2016-11-15 DIAGNOSIS — R251 Tremor, unspecified: Secondary | ICD-10-CM | POA: Diagnosis not present

## 2016-11-15 DIAGNOSIS — Z7902 Long term (current) use of antithrombotics/antiplatelets: Secondary | ICD-10-CM | POA: Insufficient documentation

## 2016-11-15 DIAGNOSIS — E118 Type 2 diabetes mellitus with unspecified complications: Secondary | ICD-10-CM

## 2016-11-15 DIAGNOSIS — G459 Transient cerebral ischemic attack, unspecified: Principal | ICD-10-CM | POA: Diagnosis present

## 2016-11-15 DIAGNOSIS — Z79899 Other long term (current) drug therapy: Secondary | ICD-10-CM | POA: Diagnosis not present

## 2016-11-15 DIAGNOSIS — K219 Gastro-esophageal reflux disease without esophagitis: Secondary | ICD-10-CM | POA: Diagnosis not present

## 2016-11-15 LAB — RAPID URINE DRUG SCREEN, HOSP PERFORMED
AMPHETAMINES: NOT DETECTED
BENZODIAZEPINES: NOT DETECTED
Barbiturates: NOT DETECTED
Cocaine: NOT DETECTED
OPIATES: NOT DETECTED
TETRAHYDROCANNABINOL: NOT DETECTED

## 2016-11-15 LAB — CBC
HCT: 45.3 % (ref 39.0–52.0)
HEMATOCRIT: 45.4 % (ref 39.0–52.0)
HEMOGLOBIN: 15.2 g/dL (ref 13.0–17.0)
Hemoglobin: 15.4 g/dL (ref 13.0–17.0)
MCH: 30 pg (ref 26.0–34.0)
MCH: 30 pg (ref 26.0–34.0)
MCHC: 34 g/dL (ref 30.0–36.0)
MCHC: 33.5 g/dL (ref 30.0–36.0)
MCV: 88.3 fL (ref 78.0–100.0)
MCV: 89.5 fL (ref 78.0–100.0)
PLATELETS: 202 10*3/uL (ref 150–400)
Platelets: 198 10*3/uL (ref 150–400)
RBC: 5.13 MIL/uL (ref 4.22–5.81)
RBC: 5.07 MIL/uL (ref 4.22–5.81)
RDW: 14.3 % (ref 11.5–15.5)
RDW: 14.4 % (ref 11.5–15.5)
WBC: 8.2 10*3/uL (ref 4.0–10.5)
WBC: 7.5 10*3/uL (ref 4.0–10.5)

## 2016-11-15 LAB — URINALYSIS, ROUTINE W REFLEX MICROSCOPIC
Bilirubin Urine: NEGATIVE
Glucose, UA: >=500 mg/dL — AB
HGB URINE DIPSTICK: NEGATIVE
Ketones, ur: NEGATIVE mg/dL
Leukocytes, UA: NEGATIVE
NITRITE: NEGATIVE
PROTEIN: NEGATIVE mg/dL
SPECIFIC GRAVITY, URINE: 1.035 — AB (ref 1.005–1.030)
pH: 5 (ref 5.0–8.0)

## 2016-11-15 LAB — DIFFERENTIAL
BASOS PCT: 0 %
Basophils Absolute: 0 10*3/uL (ref 0.0–0.1)
EOS PCT: 3 %
Eosinophils Absolute: 0.3 10*3/uL (ref 0.0–0.7)
LYMPHS PCT: 47 %
Lymphs Abs: 3.8 10*3/uL (ref 0.7–4.0)
Monocytes Absolute: 0.9 10*3/uL (ref 0.1–1.0)
Monocytes Relative: 11 %
NEUTROS ABS: 3.2 10*3/uL (ref 1.7–7.7)
NEUTROS PCT: 39 %

## 2016-11-15 LAB — I-STAT CHEM 8, ED
BUN: 19 mg/dL (ref 6–20)
Calcium, Ion: 1.15 mmol/L (ref 1.15–1.40)
Chloride: 106 mmol/L (ref 101–111)
Creatinine, Ser: 0.8 mg/dL (ref 0.61–1.24)
Glucose, Bld: 87 mg/dL (ref 65–99)
HCT: 47 % (ref 39.0–52.0)
Hemoglobin: 16 g/dL (ref 13.0–17.0)
Potassium: 3.7 mmol/L (ref 3.5–5.1)
Sodium: 142 mmol/L (ref 135–145)
TCO2: 25 mmol/L (ref 22–32)

## 2016-11-15 LAB — PROTIME-INR
INR: 0.96
PROTHROMBIN TIME: 12.6 s (ref 11.4–15.2)

## 2016-11-15 LAB — COMPREHENSIVE METABOLIC PANEL
ALBUMIN: 3.9 g/dL (ref 3.5–5.0)
ALK PHOS: 77 U/L (ref 38–126)
ALT: 23 U/L (ref 17–63)
ANION GAP: 8 (ref 5–15)
AST: 20 U/L (ref 15–41)
BUN: 16 mg/dL (ref 6–20)
CALCIUM: 9 mg/dL (ref 8.9–10.3)
CHLORIDE: 108 mmol/L (ref 101–111)
CO2: 23 mmol/L (ref 22–32)
Creatinine, Ser: 0.88 mg/dL (ref 0.61–1.24)
GFR calc Af Amer: >60 mL/min (ref >60)
GFR calc non Af Amer: >60 mL/min (ref >60)
GLUCOSE: 88 mg/dL (ref 65–99)
Potassium: 3.7 mmol/L (ref 3.5–5.1)
SODIUM: 139 mmol/L (ref 135–145)
Total Bilirubin: 1.4 mg/dL — ABNORMAL HIGH (ref 0.3–1.2)
Total Protein: 6.6 g/dL (ref 6.5–8.1)

## 2016-11-15 LAB — I-STAT TROPONIN, ED: Troponin i, poc: 0 ng/mL (ref 0.00–0.08)

## 2016-11-15 LAB — GLUCOSE, CAPILLARY: GLUCOSE-CAPILLARY: 103 mg/dL — AB (ref 65–99)

## 2016-11-15 LAB — APTT: aPTT: 26 seconds (ref 24–36)

## 2016-11-15 LAB — ETHANOL: Alcohol, Ethyl (B): <10 mg/dL

## 2016-11-15 MED ORDER — ALBUTEROL SULFATE (2.5 MG/3ML) 0.083% IN NEBU: 3.0000 mL | INHALATION_SOLUTION | Freq: Four times a day (QID) | RESPIRATORY_TRACT | Status: DC | PRN

## 2016-11-15 MED ORDER — INSULIN ASPART 100 UNIT/ML ~~LOC~~ SOLN
15.0000 [IU] | Freq: Three times a day (TID) | SUBCUTANEOUS | Status: DC
  Administered 2016-11-16 – 2016-11-17 (×4): 15 [IU] via SUBCUTANEOUS

## 2016-11-15 MED ORDER — PANTOPRAZOLE SODIUM 40 MG PO TBEC
40.0000 mg | DELAYED_RELEASE_TABLET | Freq: Every day | ORAL | Status: DC
  Administered 2016-11-15 – 2016-11-17 (×3): 40 mg via ORAL
  Filled 2016-11-15 (×3): qty 1

## 2016-11-15 MED ORDER — ROSUVASTATIN CALCIUM 5 MG PO TABS
10.0000 mg | ORAL_TABLET | Freq: Every day | ORAL | Status: DC
  Administered 2016-11-15 – 2016-11-17 (×3): 10 mg via ORAL
  Filled 2016-11-15 (×3): qty 2

## 2016-11-15 MED ORDER — ACETAMINOPHEN 325 MG PO TABS: 650.0000 mg | ORAL_TABLET | ORAL | Status: DC | PRN

## 2016-11-15 MED ORDER — CANAGLIFLOZIN 100 MG PO TABS
100.0000 mg | ORAL_TABLET | Freq: Every day | ORAL | Status: DC
  Administered 2016-11-16: 100 mg via ORAL
  Filled 2016-11-15: qty 1

## 2016-11-15 MED ORDER — INSULIN ASPART 100 UNIT/ML ~~LOC~~ SOLN: 0.0000 [IU] | Freq: Every day | SUBCUTANEOUS | Status: DC

## 2016-11-15 MED ORDER — STROKE: EARLY STAGES OF RECOVERY BOOK
Freq: Once | Status: AC
  Administered 2016-11-15: 22:00:00

## 2016-11-15 MED ORDER — ALLOPURINOL 100 MG PO TABS
300.0000 mg | ORAL_TABLET | Freq: Every day | ORAL | Status: DC
  Administered 2016-11-15 – 2016-11-17 (×3): 300 mg via ORAL
  Filled 2016-11-15 (×3): qty 3

## 2016-11-15 MED ORDER — DEXTROSE 5 % IV SOLN: 1.0000 g | INTRAVENOUS | Status: DC

## 2016-11-15 MED ORDER — CLOPIDOGREL BISULFATE 75 MG PO TABS
75.0000 mg | ORAL_TABLET | Freq: Every day | ORAL | Status: DC
  Administered 2016-11-15 – 2016-11-17 (×3): 75 mg via ORAL
  Filled 2016-11-15 (×3): qty 1

## 2016-11-15 MED ORDER — METFORMIN HCL ER 500 MG PO TB24
500.0000 mg | ORAL_TABLET | Freq: Every day | ORAL | Status: DC
  Administered 2016-11-16: 500 mg via ORAL
  Filled 2016-11-15: qty 1

## 2016-11-15 MED ORDER — LOSARTAN POTASSIUM 50 MG PO TABS
50.0000 mg | ORAL_TABLET | Freq: Every day | ORAL | Status: DC
  Administered 2016-11-15 – 2016-11-16 (×2): 50 mg via ORAL
  Filled 2016-11-15 (×2): qty 1

## 2016-11-15 MED ORDER — VENLAFAXINE HCL 37.5 MG PO TABS
75.0000 mg | ORAL_TABLET | Freq: Two times a day (BID) | ORAL | Status: DC
  Administered 2016-11-16 – 2016-11-17 (×3): 75 mg via ORAL
  Filled 2016-11-15 (×3): qty 2

## 2016-11-15 MED ORDER — INSULIN GLARGINE 100 UNIT/ML ~~LOC~~ SOLN
50.0000 [IU] | Freq: Every day | SUBCUTANEOUS | Status: DC
  Administered 2016-11-15 – 2016-11-17 (×3): 50 [IU] via SUBCUTANEOUS
  Filled 2016-11-15 (×3): qty 0.5

## 2016-11-15 MED ORDER — ASPIRIN EC 81 MG PO TBEC
81.0000 mg | DELAYED_RELEASE_TABLET | Freq: Every day | ORAL | Status: DC
  Administered 2016-11-15 – 2016-11-16 (×2): 81 mg via ORAL
  Filled 2016-11-15 (×2): qty 1

## 2016-11-15 MED ORDER — ARIPIPRAZOLE 10 MG PO TABS
5.0000 mg | ORAL_TABLET | Freq: Every day | ORAL | Status: DC
  Administered 2016-11-15 – 2016-11-16 (×2): 5 mg via ORAL
  Filled 2016-11-15 (×3): qty 1

## 2016-11-15 MED ORDER — INSULIN ASPART 100 UNIT/ML ~~LOC~~ SOLN
0.0000 [IU] | Freq: Three times a day (TID) | SUBCUTANEOUS | Status: DC
  Administered 2016-11-16: 5 [IU] via SUBCUTANEOUS
  Administered 2016-11-16 – 2016-11-17 (×2): 2 [IU] via SUBCUTANEOUS

## 2016-11-15 MED ORDER — ACETAMINOPHEN 650 MG RE SUPP: 650.0000 mg | RECTAL | Status: DC | PRN

## 2016-11-15 MED ORDER — BUPROPION HCL ER (SR) 150 MG PO TB12
150.0000 mg | ORAL_TABLET | Freq: Every day | ORAL | Status: DC
  Administered 2016-11-15 – 2016-11-17 (×3): 150 mg via ORAL
  Filled 2016-11-15 (×3): qty 1

## 2016-11-15 MED ORDER — ACETAMINOPHEN 160 MG/5ML PO SOLN: 650.0000 mg | ORAL | Status: DC | PRN

## 2016-11-15 MED ORDER — EVOLOCUMAB 140 MG/ML ~~LOC~~ SOSY: 140.0000 mg | PREFILLED_SYRINGE | SUBCUTANEOUS | Status: DC

## 2016-11-15 MED ORDER — CANAGLIFLOZIN-METFORMIN HCL ER 150-500 MG PO TB24: 2.0000 | ORAL_TABLET | Freq: Every day | ORAL | Status: DC

## 2016-11-15 NOTE — ED Notes (Signed)
Patient transported to CT 

## 2016-11-15 NOTE — Procedures (Signed)
PATIENT'S NAME:  Raymond Mays, Raymond Mays DOB:      December 15, 1945      MR#:    [id number]     DATE OF RECORDING: 11/11/2016 REFERRING M.D.:  Derinda Late, MD Study Performed:   Titration to CPAP/ BiPAP HISTORY:  return for BiPAP titration after CPAP titration from 10-11-2016 failed. AHI was 38.2/hr and CPAP and BiPAP were attempted, but the titration was incomplete.  Raymond Mays had undergone multiple sleep studies, he has been seen at [facility] and [facility] - for ENT consultation at [facility]. He was told that he was not considered an appropriate candidate for the "inspire" procedure. The patient endorsed the Epworth Sleepiness Scale at 16 points.   The patient's weight 227 pounds with a height of 73 (inches), resulting in a BMI of 30.1 kg/m2. The patient's neck circumference measured 18 inches.  CURRENT MEDICATIONS: Zyloprim, Abilify, ASA 81mg , Wellbutrin, Invokamet XR, Desowen cream, Exenatide, Veramyst, Levemir, Humalog, CoZaar, Prilosec, Crestor, Effexor PROCEDURE:  This is a multichannel digital polysomnogram utilizing the SomnoStar 11.2 system.  Electrodes and sensors were applied and monitored per AASM Specifications.   EEG, EOG, Chin and Limb EMG, were sampled at 200 Hz.  ECG, Snore and Nasal Pressure, Thermal Airflow, Respiratory Effort, CPAP Flow and Pressure, Oximetry was sampled at 50 Hz. Digital video and audio were recorded.      BiPAP was initiated at 10/6 cmH20 with heated humidity per AASM split night standards and pressure was advanced to 21/17cmH20 because of hypopneas, apneas and desaturations. But BiPAP pressure of 21/17 cmH20 with ST ( back up rate) of 10/min. achieved still no reduction of the AHI (90.9) . Lights Out was at 20:34 and Lights On at 05:02. Total recording time (TRT) was 509 minutes, with a total sleep time (TST) of 359.5 minutes. The patient's sleep latency was 102.5 minutes with 0 minutes of wake time after sleep onset. REM latency was 152.5 minutes.  The sleep efficiency was 70.6 %.     SLEEP ARCHITECTURE: WASO (Wake after sleep onset) was 111 minutes.  There were 55 minutes in Stage N1, 234.5 minutes Stage N2, 0 minutes Stage N3 and 70 minutes in Stage REM.  The percentage of Stage N1 was 15.3%, Stage N2 was 65.2%, Stage N3 was 0% and Stage R (REM sleep) was 19.5%.   The arousals were noted as: 46 were spontaneous, 17 were associated with PLMs, and 111 were still associated with respiratory events. There were no central apneas noted.  Audio and video analysis did not show any abnormal or unusual movements, behaviors, phonations or vocalizations. The patient took bathroom breaks. Snoring was noted. EKG was in keeping with normal sinus rhythm (NSR). The patient was advised to avoid supine sleep, but reported great difficulties to tolerate non supine sleep.    RESPIRATORY ANALYSIS:  There was a total of 259 respiratory events: 39 obstructive apneas, 14 central apneas and 3 mixed apneas with a total of 56 apneas and an apnea index (AI) of 9.3 /hour. There were 203 hypopneas with a hypopnea index of 33.9/hour.  The total APNEA/HYPOPNEA INDEX (AHI) was 43.2 /hour and the total RESPIRATORY DISTURBANCE INDEX was 43.2/hr.  72 events occurred in REM sleep and 187 events in NREM. The REM AHI was 61.7 /hr. versus a non-REM AHI of 38.8 /hour. The patient spent 0 minutes of total sleep time in the supine position and 360 minutes in non-supine. The supine AHI was 0.0, versus a non-supine AHI of 43.2.  OXYGEN SATURATION & C02:  The  baseline 02 saturation was 93%, with the lowest being 84%. Time spent below 89% saturation equaled 105 minutes.  PERIODIC LIMB MOVEMENTS: The patient had a total of 472 Periodic Limb Movements. The Periodic Limb Movement (PLM) index was 78.8 and the PLM Arousal index was 2.8 /hour. Post-study, the patient indicated that sleep was the same as usual. The patient was fitted with a ResMed P-10 large nasal pillows.   DIAGNOSIS : Highly fragmented sleep architecture:    1. Complex-Obstructive Sleep Apnea, not responding to CPAP or BiPAP.  2. No central apneas emerging, which kept patient from ASV titration.  3. Hypoxemia was noted-Consider oxygen alone at night, 2 liters per nasal cannula.  4. PLMs- Caffeine reduction is needed to help control PLM and reduce sleep latency.     PLANS/RECOMMENDATIONS: 1. Educate patient in sleep hygiene measures.   2. Maintain lean body weight. 3. Treatment options include Upper Airway Surgery, oxygen, and weight loss therapy. a. Avoidance of ingestion of caffeine and alcohol prior to sleeping. b. Avoiding sleeping in the supine position (on one's back). c. Improvement of nasal patency if indicated. d. Avoidance of driving if or when sleepy. 4. Oxygen by Cannula:  Recommend pulmonology/ home oxygen consultation for 2 liters per minute nasal cannula oxygen during sleep.  Baseline testing may be helpful, as well as pulmonary evaluation for apnea -etiology of nocturnal hypoxemia.  DISCUSSION:  A follow up appointment will be scheduled in the Sleep Clinic at [facility].   Please call [phone] with any questions.     I certify that I have reviewed the entire raw data recording prior to the issuance of this report in accordance with the Standards of Accreditation of the American Academy of Sleep Medicine (AASM)   Larey Seat, M.D.  11-15-2016 Diplomat, American Board of Psychiatry and Neurology  Diplomat, [place] of Sleep Medicine Medical Director, [place] [facility]

## 2016-11-15 NOTE — ED Triage Notes (Signed)
Pt reports double vision x2 weeks, denies pain or any other associated symptoms, pt ambulatory to triage, speech clear, a/ox4, no neuro deficits noted.

## 2016-11-15 NOTE — Consult Note (Signed)
NEURO HOSPITALIST CONSULT NOTE   Requestig physician: Dr. Maudie Mercury  Reason for Consult: Intermittent binocular diplopia  History obtained from:  Patient     HPI:                                                                                                                                          Raymond Mays is an 71 y.o. male who presents with a 4 week history of intermittent binocular diplopia. When it occurs the images appear vertically displaced relative to each other. The episodes last for a few minutes to up to 1/2 hour. He has had 4 total episodes. There is no associated eye pain or vision loss. When either eye is covered, the double vision resolves. The patient's wife has not seen one eye drift relative to the other and neither has the patient when he looks in the mirror. Patient is unable to state whether the double vision is worse with near or far gaze and also cannot recall any worsening/improvement when he looks up, down, to the left or to the right. No associated facial droop, limb weakness, limb numbness or limb pain. No CP or abdominal pain. The patient is being worked up by an outside Garment/textile technologist for sleep apnea. His wife is worried about recent development of a right-sided thumb tremor occurring intermittently while at rest.   Past Medical History:  Diagnosis Date  . Alcohol abuse   . CAD (coronary artery disease)    Stent to circ 2007, patent on cath 2015.  NL EF  . Cancer [facility])    prostate  . Depression   . Diabetes mellitus without complication ([place])   . Hyperlipemia   . Hypertension   . Neuropathy   . Sleep apnea     Past Surgical History:  Procedure Laterality Date  . CHOLECYSTECTOMY    . COLONOSCOPY    . heart stent    . KNEE SURGERY     bakers cyst  . PROSTATECTOMY    . TONSILLECTOMY      Family History  Problem Relation Age of Onset  . Heart attack Mother        Died 80s  . Prostate cancer Father   . Diabetes Father    Social  History:  reports that he has never smoked. He has never used smokeless tobacco. He reports that he does not drink alcohol or use drugs.  Allergies  Allergen Reactions  . Percocet [Oxycodone-Acetaminophen] Itching    MEDICATIONS:  Prior to Admission:  Prescriptions Prior to Admission  Medication Sig Dispense Refill Last Dose  . allopurinol (ZYLOPRIM) 300 MG tablet Take 300 mg by mouth daily.   11/14/2016 at Unknown time  . ARIPiprazole (ABILIFY) 5 MG tablet Take 5 mg by mouth daily.   11/14/2016 at Unknown time  . aspirin 81 MG tablet Take 81 mg by mouth daily.   11/14/2016 at Unknown time  . buPROPion (WELLBUTRIN SR) 150 MG 12 hr tablet Take 150 mg by mouth daily.    11/15/2016 at Unknown time  . Canagliflozin-Metformin HCl ER (INVOKAMET XR) 150-500 MG TB24 Take 2 tablets by mouth daily.   11/15/2016 at Unknown time  . Evolocumab (REPATHA) 140 MG/ML SOSY Inject 140 mg into the skin every 14 (fourteen) days.   11/14/2016 at Unknown time  . Exenatide ER 2 MG PEN Inject into the skin.   11/14/2016 at Unknown time  . Insulin Degludec (TRESIBA FLEXTOUCH) 200 UNIT/ML SOPN Tresiba FlexTouch U-200 insulin 200 unit/mL (3 mL) subcutaneous pen  Inject 50 units every day by subcutaneous route.   11/14/2016 at Unknown time  . insulin lispro (HUMALOG) 100 UNIT/ML injection Inject 18-20 Units into the skin 3 (three) times daily before meals. 18 u before lunch and 20 units before supper   11/15/2016 at Unknown time  . losartan (COZAAR) 50 MG tablet Take 50 mg by mouth daily.   11/14/2016 at Unknown time  . NONFORMULARY OR COMPOUNDED ITEM Apply 1 application topically 3 (three) times daily as needed (MUSCLE SORENESS). 5,000-LIDOCAINE 5% BASE GEL IN A PUMP   Past Week at Unknown time  . omeprazole (PRILOSEC) 40 MG capsule Take 20 mg by mouth 2 (two) times daily.    11/15/2016 at Unknown time  . rosuvastatin  (CRESTOR) 10 MG tablet Take 10 mg by mouth daily.   11/14/2016 at Unknown time  . venlafaxine (EFFEXOR) 75 MG tablet Take 75 mg by mouth 2 (two) times daily with a meal.   11/15/2016 at Unknown time  . albuterol (PROAIR HFA) 108 (90 Base) MCG/ACT inhaler Inhale 1-2 puffs into the lungs every 6 (six) hours as needed for wheezing.        ROS:                                                                                                                                       As per HPI.   Blood pressure (!) 150/94, pulse 71, temperature 97.9 F (36.6 C), temperature source Oral, resp. rate 18, height 6\' 1"  (1.854 m), weight 119.3 kg (262 lb 14.4 oz), SpO2 94 %.   General Examination:  HEENT-  [place]/AT    Lungs- Respirations unlabored Extremities- No edema  Neurological Examination Mental Status: Alert, fully oriented except for some trouble recalling the city ([place]), thought content appropriate.  Speech fluent without evidence of aphasia.  Able to follow all commands without difficulty. Naming intact.  Cranial Nerves: II:  Visual fields intact all 4 quadrants. PERRL.   III,IV, VI: Ptosis not present. Visual pursuits are full without exotropia or esotropia noted. No nystagmus. Does not complain of double vision when gazing in any direction during exam. Saccadic quality of his visual pursuits is noted, slightly worse when gazing to the left.   V,VII: Smile symmetric, facial temp sensation normal bilaterally VIII: hearing intact to conversatiopn IX,X: No hypophonia or hoarseness XI: Symmetric XII: midline tongue extension Motor: Right : Upper extremity   5/5    Left:     Upper extremity   5/5  Lower extremity   5/5     Lower extremity   5/5 No cogwheel rigidity noted. Occasional pill-rolling tremor on the right while at rest.  Sensory: Temp and light touch intact x 4. No  extinction.  Deep Tendon Reflexes: 1-2+ and symmetric throughout Plantars: Right: downgoing   Left: downgoing Cerebellar: Normal finger-to-nose bilaterally.  Gait: Deferred  Lab Results: Basic Metabolic Panel:  Recent Labs Lab 11/10/16 1449 11/15/16 1704 11/15/16 1720  NA 140 139 142  K 4.5 3.7 3.7  CL 103 108 106  CO2 29 23  --   GLUCOSE 137* 88 87  BUN 22 16 19   CREATININE 1.09 0.88 0.80  CALCIUM 10.4 9.0  --     Liver Function Tests:  Recent Labs Lab 11/10/16 1449 11/15/16 1704  AST 16 20  ALT 28 23  ALKPHOS 83 77  BILITOT 1.1 1.4*  PROT 7.7 6.6  ALBUMIN 4.7 3.9   No results for input(s): LIPASE, AMYLASE in the last 168 hours. No results for input(s): AMMONIA in the last 168 hours.  CBC:  Recent Labs Lab 11/15/16 1704 11/15/16 1720 11/15/16 2110  WBC 8.2  --  7.5  NEUTROABS 3.2  --   --   HGB 15.4 16.0 15.2  HCT 45.3 47.0 45.4  MCV 88.3  --  89.5  PLT 202  --  198    Cardiac Enzymes: No results for input(s): CKTOTAL, CKMB, CKMBINDEX, TROPONINI in the last 168 hours.  Lipid Panel:  Recent Labs Lab 11/10/16 1449  CHOL 208*  TRIG 206.0*  HDL 46.70  CHOLHDL 4  VLDL 41.2*    CBG:  Recent Labs Lab 11/15/16 2055  GLUCAP 103*    Microbiology: No results found for this or any previous visit.  Coagulation Studies:  Recent Labs  11/15/16 1704  LABPROT 12.6  INR 0.96    Imaging: Ct Head Wo Contrast  Result Date: 11/15/2016 CLINICAL DATA:  Diplopia for 2 weeks. EXAM: CT HEAD WITHOUT CONTRAST TECHNIQUE: Contiguous axial images were obtained from the base of the skull through the vertex without intravenous contrast. COMPARISON:  None. FINDINGS: Brain: There is no intracranial hemorrhage, mass or evidence of acute infarction. There is no extra-axial fluid collection. Gray matter and white matter appear normal. Cerebral volume is normal for age. Brainstem and posterior fossa are unremarkable. The CSF spaces appear normal. Vascular: No  hyperdense vessel or unexpected calcification. Skull: Normal. Negative for fracture or focal lesion. Sinuses/Orbits: No acute finding. Other: No findings to account for the described diplopia. IMPRESSION: Normal brain Electronically Signed   By: Shaune Pascal  Alroy Dust M.D.   On: 11/15/2016 18:43    Assessment: 71 year old male with a 4 week history of intermittent binocular diplopia 1. DDx for underlying etiology is broad, include thyroid ophthalmopathy, intrinsic orbital lesion, CN III, IV or VI lesion and possible tiny brainstem lesion in the region of the CN3 or CN6 nuclei. A diabetic nerve root infarction producing mild weakness of one of his extraocular muscles, presenting intermittently, is possible; of note, a nerve root infarction is relatively likely given his age and diabetes. Myasthenia gravis is felt to be unlikely as there has been no tendency for the diplopia to occur later in the day and there are no associated symptoms consistent with myasthenia gravis.  2. Intermittent right sided pill rolling tremor. Occurs while at rest and is also more likely to occur when walking. No complaints of shuffling gait or limb stiffness. Was observed intermittently during exam and appears most consistent with a pill-rolling tremor, which can occur in Parkinson's disease.  3. Mild memory impairment. May be related to #2 if the patient has early Parkinson's disease. 4. Sleep apnea. Results of recent outpatient sleep study are pending.   Recommendations: 1. MRI brain with and without contrast to assess for possible abnormal enhancement of CN III, IV or VI. Also obtain MRI of orbits to evaluate for possible intrinsic orbital pathology.  2. Should be seen by a movement disorders specialist at either [facility] or [facility] to evaluate for possible incipient Parkinson's disease.  3. TSH level.  4. Myasthenia gravis panel. The pretest probability of myasthenia gravis is felt to be  relatively low.  5. Will defer to outside Neurologist regarding possible trial of Aricept.   Electronically signed: Dr. Kerney Elbe 11/15/2016, 10:48 PM

## 2016-11-15 NOTE — Progress Notes (Signed)
Pt admitted from ED with stroke like symptoms, alert and oriented, denies any pain at this time, pt settled in bed with call light and family at bedside, tele monitor put and verified on pt, safety concern addressed, was however reassured and will continue to monitor, v/s stable. [place]-As[place], Jarvis Sawa Efe

## 2016-11-15 NOTE — H&P (Addendum)
TRH H&P   Patient Demographics:    Raymond Mays, is a 71 y.o. male  MRN: [id number]   DOB - December 10, 1945  Admit Date - 11/15/2016  Outpatient Primary MD for the patient is Derinda Late, MD  Referring MD/NP/PA:  Addison Lank  Outpatient Specialists:  Minus Breeding (cardiologist) Dr. Percell Miller (urology, [place], [place]) Dr Dwyane Dee (endocrinology)  Patient coming from: home  Chief Complaint  Patient presents with  . Diplopia      HPI:    Raymond Mays  is a 71 y.o. male, w hypertension, hyperlipidemia, CAD, prostate cancer who apparently c/o diplopia starting about 2 weeks ago.  Pt had 2 episodes a couple of weeks ago.  Then had an episode Saturday for about 20 minutes and then Sunday nite lasting a few minutes. Pt denies headache, dysarthria, numbness, tingling, weakness, dysphagia.    Pt spoke with pcp today and was told to come to ER.    In ED, CT brain negative for any acute process.   Wbc 8.2, Hgb 15.4, Plt 202 Bun 16, Creatinine 0.88  ED spoke with neurology requesting admission for tia, diplopia.    Review of systems:    In addition to the HPI above,  + tremor No Fever-chills, No Headache, No changes with  hearing, No problems swallowing food or Liquids, No Chest pain, Cough or Shortness of Breath, No Abdominal pain, No Nausea or Vommitting, Bowel movements are regular, No Blood in stool or Urine, No dysuria, No new skin rashes or bruises, No new joints pains-aches,  No new weakness, tingling, numbness in any extremity, No recent weight gain or loss, No polyuria, polydypsia or polyphagia, No significant Mental Stressors.  A full 10 point Review of Systems was done, except as stated above, all other Review of Systems were negative.   With Past History of the following :    Past Medical History:  Diagnosis Date  . Alcohol abuse   . CAD (coronary artery  disease)    Stent to circ 2007, patent on cath 2015.  NL EF  . Cancer [facility])    prostate  . Depression   . Diabetes mellitus without complication ([place])   . Hyperlipemia   . Hypertension   . Neuropathy   . Sleep apnea       Past Surgical History:  Procedure Laterality Date  . CHOLECYSTECTOMY    . COLONOSCOPY    . heart stent    . KNEE SURGERY     bakers cyst  . PROSTATECTOMY    . TONSILLECTOMY        Social History:     Social History  Substance Use Topics  . Smoking status: Never Smoker  . Smokeless tobacco: Never Used  . Alcohol use No     Lives - at home  Mobility - walks by self    Family History :  Family History  Problem Relation Age of Onset  . Heart attack Mother        Died 80s  . Prostate cancer Father   . Diabetes Father       Home Medications:   Prior to Admission medications   Medication Sig Start Date End Date Taking? Authorizing Provider  allopurinol (ZYLOPRIM) 300 MG tablet Take 300 mg by mouth daily.   Yes [provider]  ARIPiprazole (ABILIFY) 5 MG tablet Take 5 mg by mouth daily.   Yes [provider]  aspirin 81 MG tablet Take 81 mg by mouth daily.   Yes [provider]  buPROPion (WELLBUTRIN SR) 150 MG 12 hr tablet Take 150 mg by mouth daily.    Yes [provider]  Canagliflozin-Metformin HCl ER (INVOKAMET XR) 150-500 MG TB24 Take 2 tablets by mouth daily.   Yes [provider]  Evolocumab (REPATHA) 140 MG/ML SOSY Inject 140 mg into the skin every 14 (fourteen) days.   Yes [provider]  Exenatide ER 2 MG PEN Inject into the skin.   Yes [provider]  Insulin Degludec (TRESIBA FLEXTOUCH) 200 UNIT/ML SOPN Tresiba FlexTouch U-200 insulin 200 unit/mL (3 mL) subcutaneous pen  Inject 50 units every day by subcutaneous route.   Yes [provider]  insulin lispro (HUMALOG) 100 UNIT/ML injection Inject 18-20 Units into the skin 3 (three) times daily before  meals. 18 u before lunch and 20 units before supper   Yes [provider]  losartan (COZAAR) 50 MG tablet Take 50 mg by mouth daily.   Yes [provider]  NONFORMULARY OR COMPOUNDED ITEM Apply 1 application topically 3 (three) times daily as needed (MUSCLE SORENESS). 5,000-LIDOCAINE 5% BASE GEL IN A PUMP   Yes [provider]  omeprazole (PRILOSEC) 40 MG capsule Take 20 mg by mouth 2 (two) times daily.    Yes [provider]  rosuvastatin (CRESTOR) 10 MG tablet Take 10 mg by mouth daily.   Yes [provider]  venlafaxine (EFFEXOR) 75 MG tablet Take 75 mg by mouth 2 (two) times daily with a meal.   Yes [provider]  albuterol (PROAIR HFA) 108 (90 Base) MCG/ACT inhaler Inhale 1-2 puffs into the lungs every 6 (six) hours as needed for wheezing.    [provider]     Allergies:     Allergies  Allergen Reactions  . Percocet [Oxycodone-Acetaminophen] Itching     Physical Exam:   Vitals  Blood pressure (!) 147/97, pulse 78, temperature 98 F (36.7 C), resp. rate 18, SpO2 94 %.   1. General  lying in bed in NAD,   2. Normal affect and insight, Not Suicidal or Homicidal, Awake Alert, Oriented X 3.  3. No F.N deficits, ALL C.Nerves Intact, Strength 5/5 all 4 extremities, Sensation intact all 4 extremities, Plantars down going.  4. Ears and Eyes appear Normal, Conjunctivae clear, PERRLA. Moist Oral Mucosa.  5. Supple Neck, No JVD, No cervical lymphadenopathy appriciated, No Carotid Bruits.  6. Symmetrical Chest wall movement, Good air movement bilaterally, CTAB.  7. RRR, No Gallops, Rubs or Murmurs, No Parasternal Heave.  8. Positive Bowel Sounds, Abdomen Soft, No tenderness, No organomegaly appriciated,No rebound -guarding or rigidity.  9.  No Cyanosis, Normal Skin Turgor, No Skin Rash or Bruise.  10. Good muscle tone,  joints appear normal , no effusions, Normal ROM.  11. No Palpable Lymph Nodes in Neck or  Axillae  Good finger to nose Slight  tremor No cogwheeling No bradykinesia   Data Review:    CBC  Recent Labs Lab 11/15/16 1704 11/15/16 1720  WBC 8.2  --   HGB 15.4 16.0  HCT 45.3 47.0  PLT 202  --   MCV 88.3  --   MCH 30.0  --   MCHC 34.0  --   RDW 14.3  --   LYMPHSABS 3.8  --   MONOABS 0.9  --   EOSABS 0.3  --   BASOSABS 0.0  --    ------------------------------------------------------------------------------------------------------------------  Chemistries   Recent Labs Lab 11/10/16 1449 11/15/16 1704 11/15/16 1720  NA 140 139 142  K 4.5 3.7 3.7  CL 103 108 106  CO2 29 23  --   GLUCOSE 137* 88 87  BUN 22 16 19   CREATININE 1.09 0.88 0.80  CALCIUM 10.4 9.0  --   AST 16 20  --   ALT 28 23  --   ALKPHOS 83 77  --   BILITOT 1.1 1.4*  --    ------------------------------------------------------------------------------------------------------------------ estimated creatinine clearance is 115 mL/min (by C-G formula based on SCr of 0.8 mg/dL). ------------------------------------------------------------------------------------------------------------------ No results for input(s): TSH, T4TOTAL, T3FREE, THYROIDAB in the last 72 hours.  Invalid input(s): FREET3  Coagulation profile  Recent Labs Lab 11/15/16 1704  INR 0.96   ------------------------------------------------------------------------------------------------------------------- No results for input(s): DDIMER in the last 72 hours. -------------------------------------------------------------------------------------------------------------------  Cardiac Enzymes No results for input(s): CKMB, TROPONINI, MYOGLOBIN in the last 168 hours.  Invalid input(s): CK ------------------------------------------------------------------------------------------------------------------    Component Value Date/Time   BNP 7.0 10/07/2016 1224      ---------------------------------------------------------------------------------------------------------------  Urinalysis    Component Value Date/Time   COLORURINE YELLOW 11/15/2016 1727   APPEARANCEUR CLEAR 11/15/2016 1727   LABSPEC 1.035 (H) 11/15/2016 1727   PHURINE 5.0 11/15/2016 1727   GLUCOSEU >=500 (A) 11/15/2016 1727   HGBUR NEGATIVE 11/15/2016 1727   BILIRUBINUR NEGATIVE 11/15/2016 1727   KETONESUR NEGATIVE 11/15/2016 1727   PROTEINUR NEGATIVE 11/15/2016 1727   NITRITE NEGATIVE 11/15/2016 1727   LEUKOCYTESUR NEGATIVE 11/15/2016 1727    ----------------------------------------------------------------------------------------------------------------   Imaging Results:    Ct Head Wo Contrast  Result Date: 11/15/2016 CLINICAL DATA:  Diplopia for 2 weeks. EXAM: CT HEAD WITHOUT CONTRAST TECHNIQUE: Contiguous axial images were obtained from the base of the skull through the vertex without intravenous contrast. COMPARISON:  None. FINDINGS: Brain: There is no intracranial hemorrhage, mass or evidence of acute infarction. There is no extra-axial fluid collection. Gray matter and white matter appear normal. Cerebral volume is normal for age. Brainstem and posterior fossa are unremarkable. The CSF spaces appear normal. Vascular: No hyperdense vessel or unexpected calcification. Skull: Normal. Negative for fracture or focal lesion. Sinuses/Orbits: No acute finding. Other: No findings to account for the described diplopia. IMPRESSION: Normal brain Electronically Signed   By: Andreas Newport M.D.   On: 11/15/2016 18:43   Nsr at 75, nl axis, no st- t changes c/w ischemia.    Assessment & Plan:    Active Problems:   Type 2 diabetes mellitus with complication, with long-term current use of insulin (HCC)   TIA (transient ischemic attack)   Diplopia    Dipolopia MRI brain  Check acetylcholine ab r/o myasthenia gravis.  Appreciate neurology consult  ? TIA MRI/MRA  brain Check carotid ultrasound Check cardiac echo PT/OT/ speech consult Check hga1c, check lipid Check b12, esr, ana, tsh,  Cont aspirin Add plavix 46m po qday Cont crestor, repatha   Tremor Likely essential Appreciate neurology  input  UTI Start rocephin 1gm iv qday Await urine culture   DM2 (hga1c 10 reported per pt) Cont humalog, treisiba cont invokamet and bydureon if on formulary fsbs ac and qhs, ISS  CAD Cont aspirin, crestor, repatha, losartan  Gerd Cont PPI , omeprazole=> protonix  Depression Cont current medications.   DVT Prophylaxis Lovenox - SCDs  AM Labs Ordered, also please review Full Orders  Family Communication: Admission, patients condition and plan of care including tests being ordered have been discussed with the patient  who indicate understanding and agree with the plan and Code Status.  Code Status FULL CODE  Likely DC to  home  Condition GUARDED    Consults called: neurology per ED  Admission status: observation  Time spent in minutes : 45 minutes   Jani Gravel M.D on 11/15/2016 at 7:35 PM  Between 7am to 7pm - Pager - [phone]  After 7pm go to [email or web address] - password [facility]  [facility] - Office  [phone]

## 2016-11-15 NOTE — ED Provider Notes (Signed)
[facility] DEPT Provider Note   CSN: [id number] Arrival date & time: 11/15/16  1257     History   Chief Complaint Chief Complaint  Patient presents with  . Diplopia    HPI Raymond Mays is a 71 y.o. male.  The history is provided by the patient.  CC: diplopia  Onset/Duration: 2 week Timing: intermittent; randomly throughout the day. Has occurred 4 times in 2 weeks. Quality: vertical Severity: moderate Modifying Factors:  Improved by: closing either eye; self resolving  Worsened by: nothing Associated Signs/Symptoms:  Pertinent (+): nothing  Pertinent (-): fevers, recent infections, head trauma, headache, vision loss or blurriness, associated dizziness or vertigo, trouble swallowing or speaking, focal weakness, ataxia.   History of prostate cancer. PSA 0.4 recently.  Past Medical History:  Diagnosis Date  . Alcohol abuse   . CAD (coronary artery disease)    Stent to circ 2007, patent on cath 2015.  NL EF  . Cancer [facility])    prostate  . Depression   . Diabetes mellitus without complication ([place])   . Hyperlipemia   . Hypertension   . Neuropathy   . Sleep apnea     Patient Active Problem List   Diagnosis Date Noted  . TIA (transient ischemic attack) 11/15/2016  . Diplopia 11/15/2016  . Type 2 diabetes mellitus with complication, with long-term current use of insulin ([place]) 10/08/2016  . Coronary artery disease involving native coronary artery of native heart without angina pectoris 10/08/2016  . SOB (shortness of breath) 10/08/2016  . Excessive daytime sleepiness 09/30/2016  . Severe episode of recurrent major depressive disorder, without psychotic features ([place]) 09/30/2016  . Syncope, near 09/30/2016  . OSA (obstructive sleep apnea) 09/30/2016    Past Surgical History:  Procedure Laterality Date  . CHOLECYSTECTOMY    . COLONOSCOPY    . heart stent    . KNEE SURGERY     bakers cyst  . PROSTATECTOMY    . TONSILLECTOMY         Home Medications     Prior to Admission medications   Medication Sig Start Date End Date Taking? Authorizing Provider  allopurinol (ZYLOPRIM) 300 MG tablet Take 300 mg by mouth daily.   Yes [provider]  ARIPiprazole (ABILIFY) 5 MG tablet Take 5 mg by mouth daily.   Yes [provider]  aspirin 81 MG tablet Take 81 mg by mouth daily.   Yes [provider]  buPROPion (WELLBUTRIN SR) 150 MG 12 hr tablet Take 150 mg by mouth daily.    Yes [provider]  Canagliflozin-Metformin HCl ER (INVOKAMET XR) 150-500 MG TB24 Take 2 tablets by mouth daily.   Yes [provider]  Evolocumab (REPATHA) 140 MG/ML SOSY Inject 140 mg into the skin every 14 (fourteen) days.   Yes [provider]  Exenatide ER 2 MG PEN Inject into the skin.   Yes [provider]  Insulin Degludec (TRESIBA FLEXTOUCH) 200 UNIT/ML SOPN Tresiba FlexTouch U-200 insulin 200 unit/mL (3 mL) subcutaneous pen  Inject 50 units every day by subcutaneous route.   Yes [provider]  insulin lispro (HUMALOG) 100 UNIT/ML injection Inject 18-20 Units into the skin 3 (three) times daily before meals. 18 u before lunch and 20 units before supper   Yes [provider]  losartan (COZAAR) 50 MG tablet Take 50 mg by mouth daily.   Yes [provider]  NONFORMULARY OR COMPOUNDED ITEM Apply 1 application topically 3 (three) times daily as needed (MUSCLE  SORENESS). 5,000-LIDOCAINE 5% BASE GEL IN A PUMP   Yes [provider]  omeprazole (PRILOSEC) 40 MG capsule Take 20 mg by mouth 2 (two) times daily.    Yes [provider]  rosuvastatin (CRESTOR) 10 MG tablet Take 10 mg by mouth daily.   Yes [provider]  venlafaxine (EFFEXOR) 75 MG tablet Take 75 mg by mouth 2 (two) times daily with a meal.   Yes [provider]  albuterol (PROAIR HFA) 108 (90 Base) MCG/ACT inhaler Inhale 1-2 puffs into the lungs every 6 (six) hours as needed for wheezing.     [provider]    Family History Family History  Problem Relation Age of Onset  . Heart attack Mother        Died 80s  . Prostate cancer Father   . Diabetes Father     Social History Social History  Substance Use Topics  . Smoking status: Never Smoker  . Smokeless tobacco: Never Used  . Alcohol use No     Allergies   Percocet [oxycodone-acetaminophen]   Review of Systems Review of Systems All other systems are reviewed and are negative for acute change except as noted in the HPI  Physical Exam Updated Vital Signs BP (!) 151/89   Pulse 69   Temp 98 F (36.7 C) (Oral)   Resp 12   SpO2 92%   Physical Exam  Constitutional: He is oriented to person, place, and time. He appears well-developed and well-nourished. No distress.  HENT:  Head: Normocephalic and atraumatic.  Nose: Nose normal.  Eyes: Pupils are equal, round, and reactive to light. Conjunctivae and EOM are normal. Right eye exhibits no discharge. Left eye exhibits no discharge. No scleral icterus.  Visual fields intact.  Acuity 20/30 bilaterally with reading glasses. No current diplopia.  Neck: Normal range of motion. Neck supple.  Cardiovascular: Normal rate and regular rhythm.  Exam reveals no gallop and no friction rub.   No murmur heard. Pulmonary/Chest: Effort normal and breath sounds normal. No stridor. No respiratory distress. He has no rales.  Abdominal: Soft. He exhibits no distension. There is no tenderness.  Musculoskeletal: He exhibits no edema or tenderness.  Neurological: He is alert and oriented to person, place, and time.  Mental Status: Alert and oriented to person, place, and time. Attention and concentration normal. Speech clear. Recent memory is intact  Cranial Nerves  II Visual Fields: Intact to confrontation. Visual fields intact. III, IV, VI: Pupils equal and reactive to light and near. Full eye movement without nystagmus  V Facial Sensation: Normal. No weakness of  masticatory muscles  VII: No facial weakness or asymmetry  VIII Auditory Acuity: Grossly normal  IX/X: The uvula is midline; the palate elevates symmetrically  XI: Normal sternocleidomastoid and trapezius strength  XII: The tongue is midline. No atrophy or fasciculations.   Motor System: Muscle Strength: 5/5 and symmetric in the upper and lower extremities. No pronation or drift.  Muscle Tone: Tone and muscle bulk are normal in the upper and lower extremities.   Reflexes: DTRs: 1+ and symmetrical in all four extremities. Plantar responses are flexor bilaterally.  Coordination: Intact finger-to-nose, heel-to-shin. Resting tremor of the right hand. Sensation: Intact to light touch, and pinprick.  Gait: Deferred    Skin: Skin is warm and dry. No rash noted. He is not diaphoretic. No erythema.  Psychiatric: He has a normal mood and affect.  Vitals reviewed.    ED Treatments / Results  Labs (all labs  ordered are listed, but only abnormal results are displayed) Labs Reviewed  COMPREHENSIVE METABOLIC PANEL - Abnormal; Notable for the following:       Result Value   Total Bilirubin 1.4 (*)    All other components within normal limits  URINALYSIS, ROUTINE W REFLEX MICROSCOPIC - Abnormal; Notable for the following:    Specific Gravity, Urine 1.035 (*)    Glucose, UA >=500 (*)    Bacteria, UA RARE (*)    Squamous Epithelial / LPF 0-5 (*)    All other components within normal limits  ETHANOL  PROTIME-INR  APTT  CBC  DIFFERENTIAL  RAPID URINE DRUG SCREEN, HOSP PERFORMED  VITAMIN B12  SEDIMENTATION RATE  TSH  ANTINUCLEAR ANTIBODIES, IFA  I-STAT CHEM 8, ED  I-STAT TROPONIN, ED    EKG  EKG Interpretation  Date/Time:  Monday November 15 2016 17:12:27 EDT Ventricular Rate:  76 PR Interval:    QRS Duration: 94 QT Interval:  401 QTC Calculation: 451 R Axis:   13 Text Interpretation:  Sinus rhythm Low voltage, precordial leads No STEMI No old tracing to compare Confirmed by  Addison Lank [phone]) on 11/15/2016 5:15:45 PM       Radiology Ct Head Wo Contrast  Result Date: 11/15/2016 CLINICAL DATA:  Diplopia for 2 weeks. EXAM: CT HEAD WITHOUT CONTRAST TECHNIQUE: Contiguous axial images were obtained from the base of the skull through the vertex without intravenous contrast. COMPARISON:  None. FINDINGS: Brain: There is no intracranial hemorrhage, mass or evidence of acute infarction. There is no extra-axial fluid collection. Gray matter and white matter appear normal. Cerebral volume is normal for age. Brainstem and posterior fossa are unremarkable. The CSF spaces appear normal. Vascular: No hyperdense vessel or unexpected calcification. Skull: Normal. Negative for fracture or focal lesion. Sinuses/Orbits: No acute finding. Other: No findings to account for the described diplopia. IMPRESSION: Normal brain Electronically Signed   By: Andreas Newport M.D.   On: 11/15/2016 18:43    Procedures Procedures (including critical care time)  Medications Ordered in ED    Initial Impression / Assessment and Plan / ED Course  I have reviewed the triage vital signs and the nursing notes.  Pertinent labs & imaging results that were available during my care of the patient were reviewed by me and considered in my medical decision making (see chart for details).     Brief episodes of diplopia. Currently asymptomatic. No focal deficits on exam. CT head without evidence of metastatic disease. Labs grossly reassuring. We'll admit patient for TIA workup.  Final Clinical Impressions(s) / ED Diagnoses   Final diagnoses:  Diplopia      Kin Galbraith, Grayce Sessions, MD 11/15/16 2043

## 2016-11-16 ENCOUNTER — Observation Stay (HOSPITAL_BASED_OUTPATIENT_CLINIC_OR_DEPARTMENT_OTHER): Payer: Medicare Other

## 2016-11-16 ENCOUNTER — Observation Stay (HOSPITAL_COMMUNITY): Payer: Medicare Other

## 2016-11-16 ENCOUNTER — Encounter (HOSPITAL_COMMUNITY): Payer: Self-pay | Admitting: Radiology

## 2016-11-16 DIAGNOSIS — I371 Nonrheumatic pulmonary valve insufficiency: Secondary | ICD-10-CM

## 2016-11-16 DIAGNOSIS — E118 Type 2 diabetes mellitus with unspecified complications: Secondary | ICD-10-CM

## 2016-11-16 DIAGNOSIS — H532 Diplopia: Secondary | ICD-10-CM | POA: Diagnosis not present

## 2016-11-16 DIAGNOSIS — Z794 Long term (current) use of insulin: Secondary | ICD-10-CM | POA: Diagnosis not present

## 2016-11-16 DIAGNOSIS — K219 Gastro-esophageal reflux disease without esophagitis: Secondary | ICD-10-CM

## 2016-11-16 DIAGNOSIS — G459 Transient cerebral ischemic attack, unspecified: Secondary | ICD-10-CM | POA: Diagnosis not present

## 2016-11-16 LAB — ECHOCARDIOGRAM COMPLETE
Ao-asc: 35 cm
CHL CUP MV DEC (S): 229
CHL CUP PV REG GRAD DIAS: 9 mmHg
EERAT: 8.06
EWDT: 229 ms
FS: 40 % (ref 28–44)
HEIGHTINCHES: 73 in
IV/PV OW: 1.94
LA diam end sys: 37 mm
LA vol A4C: 42.2 ml
LADIAMINDEX: 1.47 cm/m2
LASIZE: 37 mm
LV E/e'average: 8.06
LV e' LATERAL: 9.03 cm/s
LVEEMED: 8.06
LVOT area: 3.8 cm2
LVOT diameter: 22 mm
Lateral S' vel: 9.14 cm/s
MV Peak grad: 2 mmHg
MV pk E vel: 72.8 m/s
MVPKAVEL: 92.2 m/s
PV Reg vel dias: 149 cm/s
PW: 7.22 mm — AB (ref 0.6–1.1)
TAPSE: 16.5 mm
TDI e' lateral: 9.03
TDI e' medial: 5.87
WEIGHTICAEL: 4206.4 [oz_av]

## 2016-11-16 LAB — VAS US CAROTID
LCCADSYS: -82 cm/s
LCCAPSYS: 92 cm/s
LEFT ECA DIAS: -23 cm/s
LEFT VERTEBRAL DIAS: -11 cm/s
Left CCA dist dias: -23 cm/s
Left CCA prox dias: 19 cm/s
Left ICA dist dias: -28 cm/s
Left ICA dist sys: -69 cm/s
Left ICA prox dias: -22 cm/s
Left ICA prox sys: -67 cm/s
RCCADSYS: -51 cm/s
RCCAPDIAS: -19 cm/s
RCCAPSYS: -96 cm/s
RIGHT ECA DIAS: -21 cm/s
RIGHT VERTEBRAL DIAS: -9 cm/s

## 2016-11-16 LAB — GLUCOSE, CAPILLARY
Glucose-Capillary: 175 mg/dL — ABNORMAL HIGH (ref 65–99)
Glucose-Capillary: 267 mg/dL — ABNORMAL HIGH (ref 65–99)
Glucose-Capillary: 74 mg/dL (ref 65–99)
Glucose-Capillary: 135 mg/dL — ABNORMAL HIGH (ref 65–99)

## 2016-11-16 LAB — LIPID PANEL
CHOL/HDL RATIO: 3.7 ratio
Cholesterol: 145 mg/dL (ref 0–200)
HDL: 39 mg/dL — AB (ref >40)
LDL Cholesterol: 67 mg/dL (ref 0–99)
Triglycerides: 193 mg/dL — ABNORMAL HIGH (ref <150)
VLDL: 39 mg/dL (ref 0–40)

## 2016-11-16 LAB — HEMOGLOBIN A1C
Hgb A1c MFr Bld: 10.4 % — ABNORMAL HIGH (ref 4.8–5.6)
MEAN PLASMA GLUCOSE: 251.78 mg/dL

## 2016-11-16 LAB — SEDIMENTATION RATE: Sed Rate: 2 mm/hr (ref 0–16)

## 2016-11-16 LAB — VITAMIN B12: VITAMIN B 12: 472 pg/mL (ref 180–914)

## 2016-11-16 LAB — TSH: TSH: 2.734 u[IU]/mL (ref 0.350–4.500)

## 2016-11-16 MED ORDER — IOPAMIDOL (ISOVUE-370) INJECTION 76%
INTRAVENOUS | Status: AC
  Administered 2016-11-16: 50 mL
  Filled 2016-11-16: qty 50

## 2016-11-16 MED ORDER — ASPIRIN EC 325 MG PO TBEC
325.0000 mg | DELAYED_RELEASE_TABLET | Freq: Every day | ORAL | Status: DC
  Administered 2016-11-17: 325 mg via ORAL
  Filled 2016-11-16: qty 1

## 2016-11-16 MED ORDER — LIVING WELL WITH DIABETES BOOK
Freq: Once | Status: AC
  Administered 2016-11-16: 14:00:00
  Filled 2016-11-16 (×2): qty 1

## 2016-11-16 MED ORDER — GADOBENATE DIMEGLUMINE 529 MG/ML IV SOLN
20.0000 mL | Freq: Once | INTRAVENOUS | Status: AC
  Administered 2016-11-16: 20 mL via INTRAVENOUS

## 2016-11-16 NOTE — Evaluation (Signed)
Physical Therapy Evaluation and Discharge Patient Details Name: Raymond Mays MRN: [id number] DOB: September 30, 1945 Today's Date: 11/16/2016   History of Present Illness  Pt is a 71 y/o male with a PMH significant for HTN, hyperlipidemia, CAD, prostate CA. He reports diplopia episodes starting~2 weeks PTA. CT negative for acute changes. MRI pending.   Clinical Impression  Patient evaluated by Physical Therapy with no further acute PT needs identified. All education has been completed and the patient has no further questions. At the time of PT eval pt was able to perform transfers and ambulation with gross modified independence. Pt reports some dizziness with head turns during the DGI but noted minimal sway and no need for assist. Pt reports he is back to baseline of function. Will defer therapy follow-up to OT. See below for any follow-up Physical Therapy or equipment needs. PT is signing off. Thank you for this referral.     Follow Up Recommendations No PT follow up    Equipment Recommendations  None recommended by PT    Recommendations for Other Services       Precautions / Restrictions Precautions Precautions: None Restrictions Weight Bearing Restrictions: No      Mobility  Bed Mobility Overal bed mobility: Modified Independent                Transfers Overall transfer level: Modified independent Equipment used: None                Ambulation/Gait Ambulation/Gait assistance: Modified independent (Device/Increase time) Ambulation Distance (Feet): 600 Feet Assistive device: None Gait Pattern/deviations: WFL(Within Functional Limits)   Gait velocity interpretation: at or above normal speed for age/gender General Gait Details: Initially with decreased gait speed but able to make corrective changes with cueing. Pt reported some dizziness with head turns however minimal sway noted and no assist required.   Stairs            Wheelchair Mobility    Modified  Rankin (Stroke Patients Only)       Balance Overall balance assessment: Modified Independent                               Standardized Balance Assessment Standardized Balance Assessment : Dynamic Gait Index   Dynamic Gait Index Level Surface: Normal Change in Gait Speed: Normal Gait with Horizontal Head Turns: Mild Impairment Gait with Vertical Head Turns: Mild Impairment Gait and Pivot Turn: Normal Step Over Obstacle: Mild Impairment Step Around Obstacles: Normal Steps: Mild Impairment Total Score: 20       Pertinent Vitals/Pain Pain Assessment: No/denies pain    Home Living Family/patient expects to be discharged to:: Private residence Living Arrangements: Spouse/significant other Available Help at Discharge: Family;Available 24 hours/day Type of Home: House Home Access: Stairs to enter Entrance Stairs-Rails: None Entrance Stairs-Number of Steps: 3 Home Layout: One level Home Equipment: Grab bars - tub/shower      Prior Function Level of Independence: Independent         Comments: Pt drives     Hand Dominance   Dominant Hand: Right    Extremity/Trunk Assessment   Upper Extremity Assessment Upper Extremity Assessment: Overall WFL for tasks assessed    Lower Extremity Assessment Lower Extremity Assessment: Overall WFL for tasks assessed    Cervical / Trunk Assessment Cervical / Trunk Assessment: Other exceptions Cervical / Trunk Exceptions: Noted forward head/rounded shoulder posture  Communication   Communication: No difficulties  Cognition Arousal/Alertness: Awake/alert  Behavior During Therapy: WFL for tasks assessed/performed Overall Cognitive Status: Within Functional Limits for tasks assessed                                        General Comments      Exercises     Assessment/Plan    PT Assessment Patent does not need any further PT services  PT Problem List         PT Treatment Interventions       PT Goals (Current goals can be found in the Care Plan section)  Acute Rehab PT Goals Patient Stated Goal: Home at d/c PT Goal Formulation: All assessment and education complete, DC therapy    Frequency     Barriers to discharge        Co-evaluation               AM-PAC PT "6 Clicks" Daily Activity  Outcome Measure Difficulty turning over in bed (including adjusting bedclothes, sheets and blankets)?: None Difficulty moving from lying on back to sitting on the side of the bed? : None Difficulty sitting down on and standing up from a chair with arms (e.g., wheelchair, bedside commode, etc,.)?: None Help needed moving to and from a bed to chair (including a wheelchair)?: None Help needed walking in hospital room?: None Help needed climbing 3-5 steps with a railing? : None 6 Click Score: 24    End of Session Equipment Utilized During Treatment: Gait belt Activity Tolerance: Patient tolerated treatment well Patient left: in chair;with call bell/phone within reach;with family/visitor present (OT present) Nurse Communication: Mobility status PT Visit Diagnosis: Other symptoms and signs involving the nervous system (R29.898)    Time: 4944-9675 PT Time Calculation (min) (ACUTE ONLY): 16 min   Charges:   PT Evaluation $PT Eval Low Complexity: 1 Low     PT G Codes:   PT G-Codes **NOT FOR INPATIENT CLASS** Functional Assessment Tool Used: Clinical judgement Functional Limitation: Mobility: Walking and moving around Mobility: Walking and Moving Around Current Status (F1638): At least 1 percent but less than 20 percent impaired, limited or restricted Mobility: Walking and Moving Around Goal Status [phone]): At least 1 percent but less than 20 percent impaired, limited or restricted Mobility: Walking and Moving Around Discharge Status [phone]): At least 1 percent but less than 20 percent impaired, limited or restricted    Rolinda Roan, PT, DPT Acute Rehabilitation  Services Pager: [place] 11/16/2016, 11:44 AM

## 2016-11-16 NOTE — Evaluation (Signed)
Occupational Therapy Evaluation Patient Details Name: Raymond Mays MRN: [id number] DOB: 02-May-1945 Today's Date: 11/16/2016    History of Present Illness Pt is a 71 y/o male with a PMH significant for HTN, hyperlipidemia, CAD, prostate CA. He reports diplopia episodes starting~2 weeks PTA. CT negative for acute changes. MRI pending.    Clinical Impression   PTA, pt was living with his wife and was independent. Pt currently performing ADLs and functional mobility at baseline. No current double vision, weakness in BUEs, or sensation deficits. Educated pt on [employer] for stroke signs and symptoms as well as compensatory techniques for vision deficits. Recommend dc once medically stable per phsycian. All acute OT need met and will sign off. Please re-order if status changes. Thank you.    Follow Up Recommendations  No OT follow up;Supervision - Intermittent    Equipment Recommendations  None recommended by OT    Recommendations for Other Services       Precautions / Restrictions Precautions Precautions: None Restrictions Weight Bearing Restrictions: No      Mobility Bed Mobility Overal bed mobility: Modified Independent                Transfers                 General transfer comment: Up in recliner upon arrival    Balance Overall balance assessment: Modified Independent                                         ADL either performed or assessed with clinical judgement   ADL Overall ADL's : Needs assistance/impaired                                       General ADL Comments: Pt performing ADLs and fucnitonal mobility at supervision level     Vision Baseline Vision/History: No visual deficits Patient Visual Report: No change from baseline Vision Assessment?: Yes Eye Alignment: Within Functional Limits Ocular Range of Motion: Within Functional Limits Alignment/Gaze Preference: Within Defined Limits Tracking/Visual Pursuits:  Able to track stimulus in all quads without difficulty Convergence: Within functional limits Visual Fields: No apparent deficits Diplopia Assessment: Other (comment) (Presenting to ED with double vision. No double vision at eva) Additional Comments: Pt presenting with double vision. however, pt without double vision during evaluation. Discussed compensatory techniques for safety with vision deficits.      Perception     Praxis      Pertinent Vitals/Pain Pain Assessment: No/denies pain     Hand Dominance Right   Extremity/Trunk Assessment Upper Extremity Assessment Upper Extremity Assessment: Overall WFL for tasks assessed   Lower Extremity Assessment Lower Extremity Assessment: Overall WFL for tasks assessed   Cervical / Trunk Assessment Cervical / Trunk Assessment: Other exceptions Cervical / Trunk Exceptions: Noted forward head/rounded shoulder posture   Communication Communication Communication: No difficulties   Cognition Arousal/Alertness: Awake/alert Behavior During Therapy: WFL for tasks assessed/performed Overall Cognitive Status: Within Functional Limits for tasks assessed                                     General Comments  Wife present throughout session. Educated pt and wife of BEFAST for stroke signs and symptoms  Exercises     Shoulder Instructions      Home Living Family/patient expects to be discharged to:: Private residence Living Arrangements: Spouse/significant other Available Help at Discharge: Family;Available 24 hours/day Type of Home: House Home Access: Stairs to enter [employer] of Steps: 3 Entrance Stairs-Rails: None Home Layout: One level     Bathroom Shower/Tub: Tub/shower unit;Walk-in shower (Uses the walk in most)   Bathroom Toilet: Standard     Home Equipment: Grab bars - tub/shower          Prior Functioning/Environment Level of Independence: Independent        Comments: Pt drives         OT Problem List: Impaired vision/perception;Impaired balance (sitting and/or standing)      OT Treatment/Interventions:      OT Goals(Current goals can be found in the care plan section) Acute Rehab OT Goals Patient Stated Goal: Home at d/c OT Goal Formulation: With patient Time For Goal Achievement: 11/30/16 Potential to Achieve Goals: Good  OT Frequency:     Barriers to D/C:            Co-evaluation              AM-PAC PT "6 Clicks" Daily Activity     Outcome Measure Help from another person eating meals?: None Help from another person taking care of personal grooming?: None Help from another person toileting, which includes using toliet, bedpan, or urinal?: None Help from another person bathing (including washing, rinsing, drying)?: None Help from another person to put on and taking off regular upper body clothing?: None Help from another person to put on and taking off regular lower body clothing?: None 6 Click Score: 24   End of Session Nurse Communication: Mobility status  Activity Tolerance: Patient tolerated treatment well Patient left: in chair;with call bell/phone within reach;with family/visitor present  OT Visit Diagnosis: Unsteadiness on feet (R26.81);Low vision, both eyes (H54.2)                Time: 7425-9563 OT Time Calculation (min): 15 min Charges:  OT General Charges $OT Visit: 1 Visit OT Evaluation $OT Eval Low Complexity: 1 Low G-Codes: OT G-codes **NOT FOR INPATIENT CLASS** Functional Assessment Tool Used: Clinical judgement Functional Limitation: Self care Self Care Current Status (O7564): At least 1 percent but less than 20 percent impaired, limited or restricted Self Care Goal Status (P3295): 0 percent impaired, limited or restricted Self Care Discharge Status [phone]): At least 1 percent but less than 20 percent impaired, limited or restricted   [place], OTR/L Acute Rehab Pager: [phone] Office:  [place] 11/16/2016, 5:21 PM

## 2016-11-16 NOTE — Progress Notes (Signed)
PROGRESS NOTE    Raymond Mays  VZD:[id number] DOB: 1945/08/29 DOA: 11/15/2016 PCP: Derinda Late, MD   Chief Complaint  Patient presents with  . Diplopia    Brief Narrative:  HPI on 11/15/2016 by Dr. Jani Gravel Raymond Mays  is a 71 y.o. male, w hypertension, hyperlipidemia, CAD, prostate cancer who apparently c/o diplopia starting about 2 weeks ago.  Pt had 2 episodes a couple of weeks ago.  Then had an episode Saturday for about 20 minutes and then Sunday nite lasting a few minutes. Pt denies headache, dysarthria, numbness, tingling, weakness, dysphagia.    Pt spoke with pcp today and was told to come to ER.   Assessment & Plan   Diplopia, suspect TIA vs CVA -CT head normal -Neurology consulted appreciated, ordered myasthenia gravis panel, TSH. Patient should be seen by movement disorder specialist to evaluate possible Parkinson's disease. Obtain MRI brain with and without contrast as well as MRI of the orbits to evaluate for possible intrinsic orbital pathology. Defer trial of Aricept to outside neurology. -Pending MRI -PT evaluated patient, no further follow-up needed -Echocardiogram shows EF of 2-951%, grade 1 diastolic dysfunction, no defect or PFO identified -Carotid Doppler bilateral 1-39% ICA stenosis, antegrade vertebral flow -ESR 2, TSH 2.734, vitamin B-12 472 -Hemoglobin A1c 10.4, LDL 67 -Continue aspirin, statin, Plavix  Questionable UTI -per H&P -UA: Showed rare bacteria, 6-3 WBC, negative nitrates and leukocytes -Urine culture not ordered on  (will see if this can be ordered) -patient currently denies any pain or burning with urination -discontinued antibiotics  Tremor -See discussion as above -pill rolling tremor -needs outpatient neurology follow up for possible parkinson's evaluation  Diabetes mellitus, type II -Hemoglobin A1c 10.4 -Patient will need follow-up with primary care for better glucose control -diabetes coordinator following -will hold metformin  and invokana while patient is in the hospital as patient is receiving IV contrast -Continue Lantus, premeal insulin, ISS and CBG monitoring   CAD -Currently no chest pain -Continue Plavix, aspirin, statin  Essential hypertension -Will discontinue Losartan for now and allow for permissive HTN given possibility of CVA  GERD -Continue PPI  Depression -Continue Abilify, Effexor  DVT Prophylaxis  SCDs  Code Status: Full  Family Communication:  Wife at bedside   Disposition Plan: observation, pending workup  Consultants Neurology  Procedures  Echocardigoram Carotid doppler  Antibiotics   Anti-infectives    Start     Dose/Rate Route Frequency Ordered Stop   11/15/16 2015  cefTRIAXone (ROCEPHIN) 1 g in dextrose 5 % 50 mL IVPB  Status:  Discontinued     1 g 100 mL/hr over 30 Minutes Intravenous Every 24 hours 11/15/16 2009 11/15/16 2041      Subjective:   Raymond Mays seen and examined today.  States his double vision is gone right now and states it comes and goes and can last about 20 minutes at a time.  Patient denies dizziness, chest pain, shortness of breath, abdominal pain, N/V/D/C, new weakness, numbess, tingling.    Objective:   Vitals:   11/15/16 2300 11/16/16 0100 11/16/16 0400 11/16/16 1510  BP: (!) 134/98 (!) 141/82 (!) 155/98 139/85  Pulse: 80 74 84 73  Resp: _0 Temp:   97.7 F (36.5 C) 98 F (36.7 C)  TempSrc:   Oral Oral  SpO2: 93% 92% 99% 96%  Weight:      Height:        Intake/Output Summary (Last 24 hours) at 11/16/16 1518 Last data filed  at 11/15/16 2200  Gross per 24 hour  Intake              240 ml  Output                0 ml  Net              240 ml   Filed Weights   11/15/16 2045  Weight: 119.3 kg (262 lb 14.4 oz)    Exam  General: Well developed, well nourished, NAD, appears stated age  HEENT: NCAT, PERRLA, EOMI, Anicteic Sclera, mucous membranes moist.   Neck: Supple, no JVD, no masses  Cardiovascular: S1 S2  auscultated, no rubs, murmurs or gallops. Regular rate and rhythm.  Respiratory: Clear to auscultation bilaterally with equal chest rise  Abdomen: Soft, nontender, nondistended, + bowel sounds  Extremities: warm dry without cyanosis clubbing or edema  Neuro: AAOx3, cranial nerves grossly intact. Strength 5/5 in patient's upper and lower extremities bilaterally  Skin: Without rashes exudates or nodules  Psych: Normal affect and demeanor with intact judgement and insight   Data Reviewed: I have personally reviewed following labs and imaging studies  CBC:  Recent Labs Lab 11/15/16 1704 11/15/16 1720 11/15/16 2110  WBC 8.2  --  7.5  NEUTROABS 3.2  --   --   HGB 15.4 16.0 15.2  HCT 45.3 47.0 45.4  MCV 88.3  --  89.5  PLT 202  --  638   Basic Metabolic Panel:  Recent Labs Lab 11/10/16 1449 11/15/16 1704 11/15/16 1720  NA 140 139 142  K 4.5 3.7 3.7  CL 103 108 106  CO2 29 23  --   GLUCOSE 137* 88 87  BUN _0 CREATININE 1.09 0.88 0.80  CALCIUM 10.4 9.0  --    GFR: Estimated Creatinine Clearance: 116.3 mL/min (by C-G formula based on SCr of 0.8 mg/dL). Liver Function Tests:  Recent Labs Lab 11/10/16 1449 11/15/16 1704  AST 16 20  ALT 28 23  ALKPHOS 83 77  BILITOT 1.1 1.4*  PROT 7.7 6.6  ALBUMIN 4.7 3.9   No results for input(s): LIPASE, AMYLASE in the last 168 hours. No results for input(s): AMMONIA in the last 168 hours. Coagulation Profile:  Recent Labs Lab 11/15/16 1704  INR 0.96   Cardiac Enzymes: No results for input(s): CKTOTAL, CKMB, CKMBINDEX, TROPONINI in the last 168 hours. BNP (last 3 results) No results for input(s): PROBNP in the last 8760 hours. HbA1C:  Recent Labs  11/16/16 0422  HGBA1C 10.4*   CBG:  Recent Labs Lab 11/15/16 2055 11/16/16 0605 11/16/16 1109  GLUCAP 103* 175* 267*   Lipid Profile:  Recent Labs  11/16/16 0422  CHOL 145  HDL 39*  LDLCALC 67  TRIG 193*  CHOLHDL 3.7   Thyroid Function  Tests:  Recent Labs  11/16/16 0422  TSH 2.734   Anemia Panel:  Recent Labs  11/16/16 0422  VITAMINB12 472   Urine analysis:    Component Value Date/Time   COLORURINE YELLOW 11/15/2016 [place] 11/15/2016 1727   LABSPEC 1.035 (H) 11/15/2016 1727   PHURINE 5.0 11/15/2016 1727   GLUCOSEU >=500 (A) 11/15/2016 1727   HGBUR NEGATIVE 11/15/2016 1727   BILIRUBINUR NEGATIVE 11/15/2016 1727   KETONESUR NEGATIVE 11/15/2016 1727   PROTEINUR NEGATIVE 11/15/2016 1727   NITRITE NEGATIVE 11/15/2016 1727   LEUKOCYTESUR NEGATIVE 11/15/2016 1727   Sepsis Labs: _1 (procalcitonin:4,lacticidven:4)  )No results found for this or any previous visit (  from the past 240 hour(s)).    Radiology Studies: Ct Head Wo Contrast  Result Date: 11/15/2016 CLINICAL DATA:  Diplopia for 2 weeks. EXAM: CT HEAD WITHOUT CONTRAST TECHNIQUE: Contiguous axial images were obtained from the base of the skull through the vertex without intravenous contrast. COMPARISON:  None. FINDINGS: Brain: There is no intracranial hemorrhage, mass or evidence of acute infarction. There is no extra-axial fluid collection. Gray matter and white matter appear normal. Cerebral volume is normal for age. Brainstem and posterior fossa are unremarkable. The CSF spaces appear normal. Vascular: No hyperdense vessel or unexpected calcification. Skull: Normal. Negative for fracture or focal lesion. Sinuses/Orbits: No acute finding. Other: No findings to account for the described diplopia. IMPRESSION: Normal brain Electronically Signed   By: Andreas Newport M.D.   On: 11/15/2016 18:43     Scheduled Meds: . allopurinol  300 mg Oral Daily  . ARIPiprazole  5 mg Oral Daily  . aspirin EC  81 mg Oral Daily  . buPROPion  150 mg Oral Daily  . canagliflozin  100 mg Oral QAC breakfast  . clopidogrel  75 mg Oral Daily  . insulin aspart  0-5 Units Subcutaneous QHS  . insulin aspart  0-9 Units Subcutaneous TID WC  . insulin  aspart  15 Units Subcutaneous TID WC  . insulin glargine  50 Units Subcutaneous Daily  . living well with diabetes book   Does not apply Once  . losartan  50 mg Oral Daily  . metFORMIN  500 mg Oral Q breakfast  . pantoprazole  40 mg Oral Daily  . rosuvastatin  10 mg Oral Daily  . venlafaxine  75 mg Oral BID WC   Continuous Infusions:   LOS: 0 days   Time Spent in minutes   30 minutes  Jessenya Berdan D.O. on 11/16/2016 at 3:18 PM  Between 7am to 7pm - Pager - [phone]  After 7pm go to [email or web address] - password [email or web address]  And look for the night coverage person covering for me after hours  [facility] Office  [phone]

## 2016-11-16 NOTE — Progress Notes (Signed)
  Echocardiogram 2D Echocardiogram has been performed.  Raymond Mays G Murlean Seelye 11/16/2016, 10:08 AM

## 2016-11-16 NOTE — Plan of Care (Signed)
Problem: Food- and Nutrition-Related Knowledge Deficit (NB-1.1) Goal: Nutrition education Formal process to instruct or train a patient/client in a skill or to impart knowledge to help patients/clients voluntarily manage or modify food choices and eating behavior to maintain or improve health. Outcome: Adequate for Discharge  RD consulted for nutrition education regarding diabetes.   Lab Results  Component Value Date   HGBA1C 10.4 (H) 11/16/2016   Spoke with patient and his wife at bedside. Patient's wife reports he eats lots of sweets, cake, large stack of pancakes with syrup. She states he knows how to eat to control his blood sugar but does not always adhere to it. States they love chocolate. Discussed plate method with patient and encouraged eating more vegetables, more protein, less carbohydrate, less fried foods, and smaller portions of sweets. Patient verbalized understanding of need to change and the importance of controlling his A1C.  RD provided "Carbohydrate Counting for People with Diabetes" handout from the Academy of [employer] and Dietetics. Discussed different food groups and their effects on blood sugar, emphasizing carbohydrate-containing foods. Provided list of carbohydrates and recommended serving sizes of common foods.  Discussed importance of controlled and consistent carbohydrate intake throughout the day. Provided examples of ways to balance meals/snacks and encouraged intake of high-fiber, whole grain complex carbohydrates. Teach back method used.  Expect fair compliance.  Body mass index is 34.69 kg/m. Pt meets criteria for obese class I based on current BMI.  Current diet order is heart healthy/carb modified. No meal completion documented. Labs and medications reviewed. No further nutrition interventions warranted at this time. RD contact information provided. If additional nutrition issues arise, please re-consult RD.  Satira Anis. Raymond Hahne, MS, RD LDN Inpatient Clinical  Dietitian Pager [phone]

## 2016-11-16 NOTE — Progress Notes (Signed)
OT Cancellation    11/16/16 0900  OT Visit Information  Last OT Received On 11/16/16  Reason Eval/Treat Not Completed Patient at procedure or test/ unavailable (Off the floor for vascular. Will return as schedule allows)   [place], OTR/L Acute Rehab Pager: [phone] Office: [phone]

## 2016-11-16 NOTE — Progress Notes (Signed)
*  PRELIMINARY RESULTS* Vascular Ultrasound Carotid Duplex (Doppler) has been completed.  Preliminary findings: Bilateral 1-39% ICA stenosis, antegrade vertebral flow.   Everrett Coombe 11/16/2016, 9:14 AM

## 2016-11-16 NOTE — Progress Notes (Signed)
Inpatient Diabetes Program Recommendations  AACE/ADA: New Consensus Statement on Inpatient Glycemic Control (2015)  Target Ranges:  Prepandial:   less than 140 mg/dL      Peak postprandial:   less than 180 mg/dL (1-2 hours)      Critically ill patients:  140 - 180 mg/dL   Lab Results  Component Value Date   GLUCAP 267 (H) 11/16/2016   HGBA1C 10.4 (H) 11/16/2016    Review of Glycemic Control Results for SERGEI, DELO (MRN [id number]) as of 11/16/2016 13:32  Ref. Range 11/15/2016 20:55 11/16/2016 06:05 11/16/2016 11:09  Glucose-Capillary Latest Ref Range: 65 - 99 mg/dL 103 (H) 175 (H) 267 (H)   Diabetes history: DM2 Outpatient Diabetes medications: Tresiba 50 units qd + Humalog 18-20 units tid + Metformin + Invokamet 150-500 2 tabs daily Current orders for Inpatient glycemic control: Lantus 50 units + Novolog 15 units tid + Metformin 500 mg qd + Novolog correction sensitive scale + Invokana  Inpatient Diabetes Program Recommendations:   Spoke with wife (Paitient having MRI) about A1C results  Of 10.4 (average blood glucose 252 over the past 2-3 months) and explained what an A1C is, basic pathophysiology of DM Type 2, basic home care, basic diabetes diet nutrition principles, importance of checking CBGs and maintaining good CBG control to prevent long-term and short-term complications. Reviewed signs and symptoms of hyperglycemia and hypoglycemia and how to treat hypoglycemia at home. Also reviewed blood sugar goals at home.  RNs to provide ongoing basic DM education at bedside with this patient. Have ordered educational booklet and DM videos. Have also placed RD consult for DM diet education for this patient.   Thank you, Nani Gasser. Jaimie Redditt, RN, MSN, CDE  Diabetes Coordinator Inpatient Glycemic Control Team Team Pager [phone] (8am-5pm) 11/16/2016 1:41 PM

## 2016-11-16 NOTE — Progress Notes (Addendum)
Neurology PROGRESS NOTE  S: Patient seen and examined No new complaint. No more diplopia.  O: Gen.: Well-developed well-nourished in no acute distress HEENT: No cephalic atraumatic Cardiovascular: S1-S2 heard Respiratory: Chest clear to auscultation Neurological exam Awake alert oriented 3. Speech is clear. Naming, attention and repetition intact. Pupils equal round reactive to light, extraocular movements showing gaze nystagmus on the left and possibly rotational nystagmus with prolonged upward gaze, visual fields full, face symmetric, auditory acuity intact, palate midline, tongue midline, shoulder shrug intact. 5/5 strength all over Sensation intact all over 2+ DTRs all over Finger-nose-finger intact.  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  Assessment 71 year old man comes in with intermittent diplopia.  Differentials considered: --Acute stroke/TIA given the mid basilar stenosis. 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 -MRI brain negative for stroke.  -Antiplatelet - c/w Aspirin (consider DAPT if proven intracranial stenosis after more imaging)   Possible Drug induced Parkinsons -D/C Abilify (could be related to pill-rolling tremor, gait shuffling and gait instability along with autonomic instability that can precipitate vertebrobasilar insufficiency)  Other causes of diplopia We have sent out the workup for myasthenia gravis - in the form of AchR antibodies - binding/blocking/modulating. We will also order MuSK  antibodies Could still be diabetic cranial neuropathy versus a very small brainstem stroke which was small enough to be missed on conventional imaging. Risk factor modification.  Stroke team will follow with you in AM.  -- Amie Portland, MD [facility] [phone]  If 7pm to 7am, please call on call as listed on AMION.

## 2016-11-17 DIAGNOSIS — K219 Gastro-esophageal reflux disease without esophagitis: Secondary | ICD-10-CM | POA: Diagnosis not present

## 2016-11-17 DIAGNOSIS — G459 Transient cerebral ischemic attack, unspecified: Secondary | ICD-10-CM | POA: Diagnosis not present

## 2016-11-17 DIAGNOSIS — E118 Type 2 diabetes mellitus with unspecified complications: Secondary | ICD-10-CM | POA: Diagnosis not present

## 2016-11-17 DIAGNOSIS — H532 Diplopia: Secondary | ICD-10-CM | POA: Diagnosis not present

## 2016-11-17 LAB — BASIC METABOLIC PANEL
ANION GAP: 7 (ref 5–15)
BUN: 21 mg/dL — AB (ref 6–20)
CHLORIDE: 105 mmol/L (ref 101–111)
CO2: 29 mmol/L (ref 22–32)
Calcium: 8.9 mg/dL (ref 8.9–10.3)
Creatinine, Ser: 1.03 mg/dL (ref 0.61–1.24)
GFR calc Af Amer: >60 mL/min (ref >60)
GFR calc non Af Amer: >60 mL/min (ref >60)
GLUCOSE: 96 mg/dL (ref 65–99)
POTASSIUM: 3.7 mmol/L (ref 3.5–5.1)
Sodium: 141 mmol/L (ref 135–145)

## 2016-11-17 LAB — GLUCOSE, CAPILLARY
GLUCOSE-CAPILLARY: 206 mg/dL — AB (ref 65–99)
GLUCOSE-CAPILLARY: 153 mg/dL — AB (ref 65–99)
Glucose-Capillary: 106 mg/dL — ABNORMAL HIGH (ref 65–99)

## 2016-11-17 LAB — CBC
HEMATOCRIT: 43.9 % (ref 39.0–52.0)
Hemoglobin: 14.3 g/dL (ref 13.0–17.0)
MCH: 29 pg (ref 26.0–34.0)
MCHC: 32.6 g/dL (ref 30.0–36.0)
MCV: 89 fL (ref 78.0–100.0)
PLATELETS: 183 10*3/uL (ref 150–400)
RBC: 4.93 MIL/uL (ref 4.22–5.81)
RDW: 14.3 % (ref 11.5–15.5)
WBC: 5.7 10*3/uL (ref 4.0–10.5)

## 2016-11-17 LAB — URINE CULTURE: Culture: 40000 — AB

## 2016-11-17 LAB — ANTINUCLEAR ANTIBODIES, IFA: ANTINUCLEAR ANTIBODIES, IFA: NEGATIVE

## 2016-11-17 MED ORDER — ASPIRIN 325 MG PO TBEC: 325.0000 mg | DELAYED_RELEASE_TABLET | Freq: Every day | ORAL | 3 refills | Status: DC

## 2016-11-17 MED ORDER — CLOPIDOGREL BISULFATE 75 MG PO TABS: 75.0000 mg | ORAL_TABLET | Freq: Every day | ORAL | 5 refills | Status: AC

## 2016-11-17 NOTE — Discharge Summary (Signed)
Physician Discharge Summary   Patient ID: Raymond Mays MRN: [id number] DOB/AGE: 1946-02-05 71 y.o.  Admit date: 11/15/2016 Discharge date: 11/17/2016  Primary Care Physician:  Raymond Late, MD  Discharge Diagnoses:    . TIA (transient ischemic attack) Tremor Diabetes mellitus type 2 Essential hypertension CAD depression   Consults:neurology  Recommendations for Outpatient Follow-up:  1. Please repeat CBC/BMET at next visit 2. Per neurology recommendations, Abilify discontinued 3. Outpatient neurology follow-up 4. Aspirin and Plavix for 3 months then Plavix alone indefinitely   DIET: carbmodified diet    Allergies:   Allergies  Allergen Reactions  . Percocet [Oxycodone-Acetaminophen] Itching     DISCHARGE MEDICATIONS: Current Discharge Medication List    START taking these medications   Details  aspirin EC 325 MG EC tablet Take 1 tablet (325 mg total) by mouth daily. Please continue Aspirin and plavix for 3 months, then plavix alone Qty: 30 tablet, Refills: 3    clopidogrel (PLAVIX) 75 MG tablet Take 1 tablet (75 mg total) by mouth daily. Qty: 30 tablet, Refills: 5      CONTINUE these medications which have NOT CHANGED   Details  allopurinol (ZYLOPRIM) 300 MG tablet Take 300 mg by mouth daily.    buPROPion (WELLBUTRIN SR) 150 MG 12 hr tablet Take 150 mg by mouth daily.     Canagliflozin-Metformin HCl ER (INVOKAMET XR) 150-500 MG TB24 Take 2 tablets by mouth daily.    Evolocumab (REPATHA) 140 MG/ML SOSY Inject 140 mg into the skin every 14 (fourteen) days.    Exenatide ER 2 MG PEN Inject into the skin.    Insulin Degludec (TRESIBA FLEXTOUCH) 200 UNIT/ML SOPN Tresiba FlexTouch U-200 insulin 200 unit/mL (3 mL) subcutaneous pen  Inject 50 units every day by subcutaneous route.    insulin lispro (HUMALOG) 100 UNIT/ML injection Inject 18-20 Units into the skin 3 (three) times daily before meals. 18 u before lunch and 20 units before supper    losartan  (COZAAR) 50 MG tablet Take 50 mg by mouth daily.    NONFORMULARY OR COMPOUNDED ITEM Apply 1 application topically 3 (three) times daily as needed (MUSCLE SORENESS). 5,000-LIDOCAINE 5% BASE GEL IN A PUMP    omeprazole (PRILOSEC) 40 MG capsule Take 20 mg by mouth 2 (two) times daily.     rosuvastatin (CRESTOR) 10 MG tablet Take 10 mg by mouth daily.    venlafaxine (EFFEXOR) 75 MG tablet Take 75 mg by mouth 2 (two) times daily with a meal.    albuterol (PROAIR HFA) 108 (90 Base) MCG/ACT inhaler Inhale 1-2 puffs into the lungs every 6 (six) hours as needed for wheezing.      STOP taking these medications     ARIPiprazole (ABILIFY) 5 MG tablet      aspirin 81 MG tablet          Brief H and P: For complete details please refer to admission H and P, but in brief HPI on 11/15/2016 by Dr. Jani Gravel [name] a 71 y.o.male,w hypertension, hyperlipidemia, CAD, prostate cancer who apparently c/o diplopia starting about 2 weeks ago. Pt had 2 episodes a couple of weeks ago. Then had an episode Saturday for about 20 minutes and then Sunday nite lasting a few minutes. Pt denies headache, dysarthria, numbness, tingling, weakness, dysphagia. Pt spoke with pcp today and was told to come to ER.   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 with evidence for distal flow, no acute infarct - CT angiogram head showed known critical proximal basilar stenosis, no visible posterior clinic indicating arteries - Neurology was consulted, recommended aspirin 325 mg daily with Plavix for 3 months then continue Plavix alone - 2-D echo showed EF of 80-99%, grade 1 diastolic dysfunction, no PFO - Carotid Doppler showed 1-39% ICA stenosis - ESR 2, TSH 2.7, B12 472 - Hemoglobin A1c 10.4, LDL 67, on statin and  repatha     Type 2 diabetes mellitus with complication, with long-term current use of insulin (HCC) - hemoglobin A1c 10.4, recommended close follow-up with PCP or endocrinology for better glucose control - Continue metformin, invokana, tresiba, humolog meal coverage    Gastroesophageal reflux disease Continue PPI  Tremor - Recommended outpatient neurology follow-up for possible Parkinson's evaluation, - Neurology recommended discontinue Abilify  Depression Continue Effexor  Hypertension - cont losartan  Day of Discharge BP (!) 136/97 (BP Location: Left Arm)   Pulse 78   Temp 98 F (36.7 C) (Oral)   Resp 18   Ht 6' 1"  (1.854 m)   Wt 119.3 kg (262 lb 14.4 oz)   SpO2 98%   BMI 34.69 kg/m   Physical Exam: General: Alert and awake oriented x3 not in any acute distress. HEENT: anicteric sclera, pupils reactive to light and accommodation CVS: S1-S2 clear no murmur rubs or gallops Chest: clear to auscultation bilaterally, no wheezing rales or rhonchi Abdomen: soft nontender, nondistended, normal bowel sounds Extremities: no cyanosis, clubbing or edema noted bilaterally Neuro: Cranial nerves II-XII intact, no focal neurological deficits   The results of significant diagnostics from this hospitalization (including imaging, microbiology, ancillary and laboratory) are listed below for reference.    LAB RESULTS: Basic Metabolic Panel:  Recent Labs Lab 11/15/16 1704 11/15/16 1720 11/17/16 0642  NA 139 142 141  K 3.7 3.7 3.7  CL 108 106 105  CO2 23  --  29  GLUCOSE 88 87 96  BUN 16 19 21*  CREATININE 0.88 0.80 1.03  CALCIUM 9.0  --  8.9   Liver Function Tests:  Recent Labs Lab 11/10/16 1449 11/15/16 1704  AST 16 20  ALT 28 23  ALKPHOS 83 77  BILITOT 1.1 1.4*  PROT 7.7 6.6  ALBUMIN 4.7 3.9   No results for input(s): LIPASE, AMYLASE in the last 168 hours. No results for input(s): AMMONIA in the last 168 hours. CBC:  Recent Labs Lab 11/15/16 1704   11/15/16 2110 11/17/16 0642  WBC 8.2  --  7.5 5.7  NEUTROABS 3.2  --   --   --   HGB 15.4  < > 15.2 14.3  HCT 45.3  < > 45.4 43.9  MCV 88.3  --  89.5 89.0  PLT 202  --  198 183  < > = values in this interval not displayed. Cardiac Enzymes: No results for input(s): CKTOTAL, CKMB, CKMBINDEX, TROPONINI in the last 168 hours. BNP: Invalid input(s): POCBNP CBG:  Recent Labs Lab 11/17/16 0902 11/17/16 1128  GLUCAP 206* 153*    Significant Diagnostic Studies:  Ct Angio Head W Or Wo Contrast  Result Date: 11/16/2016 CLINICAL DATA:  Carotid stenosis follow-up. Patient admitted with double vision. Severe basilar stenosis. EXAM: CT ANGIOGRAPHY HEAD AND NECK TECHNIQUE: Multidetector CT imaging of the head and neck was performed using the standard protocol during bolus administration of intravenous contrast. Multiplanar CT  image reconstructions and MIPs were obtained to evaluate the vascular anatomy. Carotid stenosis measurements (when applicable) are obtained utilizing NASCET criteria, using the distal internal carotid diameter as the denominator. CONTRAST:  50 cc Isovue 370 intravenous COMPARISON:  Head CT from yesterday. Brain MRI and MRA from earlier today FINDINGS: CT HEAD FINDINGS Brain: No evidence of acute infarction, hemorrhage, hydrocephalus, extra-axial collection or mass lesion/mass effect. Vascular: See below Skull: Small encapsulated fatty mass in the high occipital scalp which could be lipoma or oil cyst. Sinuses: Negative Orbits: Negative Review of the MIP images confirms the above findings CTA NECK FINDINGS Aortic arch: No atheromatous changes are dilatation. Two vessel branching. Right carotid system: Mild calcified and noncalcified plaque at the common carotid bifurcation and proximal ICA. ICA tortuosity without stenosis or ulceration. Left carotid system: Mild atheromatous changes of the ICA bulb. No stenosis, ulceration, or dissection. Vertebral arteries: No proximal subclavian  stenosis. Moderate left and mild right vertebral origin atheromatous type narrowing. There is less robust opacification when compared to the carotids due to downstream severe stenosis. Skeleton: Advanced facet arthropathy with bulky hypertrophy and C3-4 anterolisthesis. Diffuse degenerative disc narrowing with mid and lower cervical endplate spurring. No acute or aggressive finding. Other neck: No incidental mass or adenopathy. Large stone in the right submandibular region, likely at the proximal submandibular duct. The right submandibular gland is mildly atrophic compared to the left. Upper chest: Airway thickening seen in the right upper lobe. Review of the MIP images confirms the above findings CTA HEAD FINDINGS Anterior circulation: Mild intermittent calcification of the carotid siphons. Hypoplastic right A1 segment with smaller right ICA. Duplicated anterior communicating arteries. No branch occlusion or proximal flow limiting stenosis. Posterior circulation: Symmetric vertebral arteries. Prominent plaque with moderate to advanced left V4 segment stenosis before the pica origin. There is severe short segment proximal basilar stenosis that is presumably atheromatous. No aneurysm or visible intramural hematoma on preceding brain MRI. Before and after the narrowing the vessels are faintly opacified compared to the anterior circulation. No visible posterior communicating arteries. Venous sinuses: Opacified on the delayed phase. Anatomic variants: As above Delayed phase: No visible infarct or parenchymal enhancement. Review of the MIP images confirms the above findings IMPRESSION: 1. Known critical proximal basilar stenosis. No visible posterior communicating arteries; the posterior circulation shows under/delayed opacification compared to the anterior circulation. 2. Moderate left vertebral atheromatous narrowing at the origin and V4 segment. Mild right vertebral origin stenosis. 3. Mild atherosclerotic changes in  the anterior circulation without flow limiting stenosis. 4. Large stone near the proximal right submandibular duct with right submandibular gland atrophy. No acute inflammation. Electronically Signed   By: Monte Fantasia M.D.   On: 11/16/2016 20:35   Ct Head Wo Contrast  Result Date: 11/15/2016 CLINICAL DATA:  Diplopia for 2 weeks. EXAM: CT HEAD WITHOUT CONTRAST TECHNIQUE: Contiguous axial images were obtained from the base of the skull through the vertex without intravenous contrast. COMPARISON:  None. FINDINGS: Brain: There is no intracranial hemorrhage, mass or evidence of acute infarction. There is no extra-axial fluid collection. Gray matter and white matter appear normal. Cerebral volume is normal for age. Brainstem and posterior fossa are unremarkable. The CSF spaces appear normal. Vascular: No hyperdense vessel or unexpected calcification. Skull: Normal. Negative for fracture or focal lesion. Sinuses/Orbits: No acute finding. Other: No findings to account for the described diplopia. IMPRESSION: Normal brain Electronically Signed   By: Andreas Newport M.D.   On: 11/15/2016 18:43   Ct Angio Neck  W Or Wo Contrast  Result Date: 11/16/2016 CLINICAL DATA:  Carotid stenosis follow-up. Patient admitted with double vision. Severe basilar stenosis. EXAM: CT ANGIOGRAPHY HEAD AND NECK TECHNIQUE: Multidetector CT imaging of the head and neck was performed using the standard protocol during bolus administration of intravenous contrast. Multiplanar CT image reconstructions and MIPs were obtained to evaluate the vascular anatomy. Carotid stenosis measurements (when applicable) are obtained utilizing NASCET criteria, using the distal internal carotid diameter as the denominator. CONTRAST:  50 cc Isovue 370 intravenous COMPARISON:  Head CT from yesterday. Brain MRI and MRA from earlier today FINDINGS: CT HEAD FINDINGS Brain: No evidence of acute infarction, hemorrhage, hydrocephalus, extra-axial collection or mass  lesion/mass effect. Vascular: See below Skull: Small encapsulated fatty mass in the high occipital scalp which could be lipoma or oil cyst. Sinuses: Negative Orbits: Negative Review of the MIP images confirms the above findings CTA NECK FINDINGS Aortic arch: No atheromatous changes are dilatation. Two vessel branching. Right carotid system: Mild calcified and noncalcified plaque at the common carotid bifurcation and proximal ICA. ICA tortuosity without stenosis or ulceration. Left carotid system: Mild atheromatous changes of the ICA bulb. No stenosis, ulceration, or dissection. Vertebral arteries: No proximal subclavian stenosis. Moderate left and mild right vertebral origin atheromatous type narrowing. There is less robust opacification when compared to the carotids due to downstream severe stenosis. Skeleton: Advanced facet arthropathy with bulky hypertrophy and C3-4 anterolisthesis. Diffuse degenerative disc narrowing with mid and lower cervical endplate spurring. No acute or aggressive finding. Other neck: No incidental mass or adenopathy. Large stone in the right submandibular region, likely at the proximal submandibular duct. The right submandibular gland is mildly atrophic compared to the left. Upper chest: Airway thickening seen in the right upper lobe. Review of the MIP images confirms the above findings CTA HEAD FINDINGS Anterior circulation: Mild intermittent calcification of the carotid siphons. Hypoplastic right A1 segment with smaller right ICA. Duplicated anterior communicating arteries. No branch occlusion or proximal flow limiting stenosis. Posterior circulation: Symmetric vertebral arteries. Prominent plaque with moderate to advanced left V4 segment stenosis before the pica origin. There is severe short segment proximal basilar stenosis that is presumably atheromatous. No aneurysm or visible intramural hematoma on preceding brain MRI. Before and after the narrowing the vessels are faintly opacified  compared to the anterior circulation. No visible posterior communicating arteries. Venous sinuses: Opacified on the delayed phase. Anatomic variants: As above Delayed phase: No visible infarct or parenchymal enhancement. Review of the MIP images confirms the above findings IMPRESSION: 1. Known critical proximal basilar stenosis. No visible posterior communicating arteries; the posterior circulation shows under/delayed opacification compared to the anterior circulation. 2. Moderate left vertebral atheromatous narrowing at the origin and V4 segment. Mild right vertebral origin stenosis. 3. Mild atherosclerotic changes in the anterior circulation without flow limiting stenosis. 4. Large stone near the proximal right submandibular duct with right submandibular gland atrophy. No acute inflammation. Electronically Signed   By: Monte Fantasia M.D.   On: 11/16/2016 20:35   Mr Jodene Nam Neck W Wo Contrast  Result Date: 11/16/2016 CLINICAL DATA:  Intermittent binocular diplopia EXAM: MRI HEAD WITHOUT CONTRAST MRA HEAD WITHOUT CONTRAST MRA NECK WITHOUT AND WITH CONTRAST TECHNIQUE: Multiplanar, multiecho pulse sequences of the brain and surrounding structures were obtained without intravenous contrast. Angiographic images of the Circle of Willis were obtained using MRA technique without intravenous contrast. Angiographic images of the neck were obtained using MRA technique without and with intravenous contrast. Carotid stenosis measurements (when applicable) are  obtained utilizing NASCET criteria, using the distal internal carotid diameter as the denominator. CONTRAST:  38m MULTIHANCE GADOBENATE DIMEGLUMINE 529 MG/ML IV SOLN COMPARISON:  CT head without contrast 11/15/2016 FINDINGS: MRI HEAD FINDINGS Brain: Mild periventricular and subcortical white matter changes are present bilaterally. No acute infarct, hemorrhage, or mass lesion is present. The ventricles are of normal size. No significant extra-axial fluid collection is  present. The internal auditory canals are within normal limits bilaterally. The brainstem and cerebellum are normal. Vascular: Flow is present in the major intracranial arteries. Skull and upper cervical spine: Skullbase is within normal limits. Midline sagittal structures are unremarkable. Craniocervical junction is normal. Marrow signal is within normal limits. Sinuses/Orbits: The paranasal sinuses and mastoid air cells are clear. Bilateral globes and orbits are within normal limits. MRA HEAD FINDINGS Focal signal loss is present in the mid basilar artery suggesting a high-grade focal stenosis or embolus. There is flow in the distal basilar artery. Both posterior cerebral arteries originate from basilar tip. PCA branch vessels are unremarkable. The internal carotid artery is are within normal limits from the skullbase through the ICA terminus. The left A1 segment is dominant. The anterior communicating artery is patent. ACA and MCA branch vessels are within normal limits bilaterally. MRA NECK FINDINGS Time-of-flight and enhance sequences demonstrate no significant flow disturbance at either carotid bifurcation. Flow is antegrade within the vertebral artery is bilaterally. A 3 vessel arch configuration is present. Scratched at there is a common origin of the left common carotid artery in the innominate artery. The right common carotid artery is within normal limits. Bifurcation is unremarkable. Cervical right ICA is normal. The left common carotid artery is within normal limits. The left carotid bifurcation is normal. The cervical left ICA is normal. The vertebral arteries are codominant. There is mild narrowing just proximal to the vertebrobasilar junction. 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 IMPRESSION: 1. 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 2. No acute infarct or ischemia. 3. Mild periventricular and subcortical white matter changes bilaterally  likely reflect the sequela of chronic microvascular ischemia. Electronically Signed   By: CSan MorelleM.D.   On: 11/16/2016 15:03   Mr Brain Wo Contrast  Result Date: 11/16/2016 CLINICAL DATA:  Intermittent binocular diplopia EXAM: MRI HEAD WITHOUT CONTRAST MRA HEAD WITHOUT CONTRAST MRA NECK WITHOUT AND WITH CONTRAST TECHNIQUE: Multiplanar, multiecho pulse sequences of the brain and surrounding structures were obtained without intravenous contrast. Angiographic images of the Circle of Willis were obtained using MRA technique without intravenous contrast. Angiographic images of the neck were obtained using MRA technique without and with intravenous contrast. Carotid stenosis measurements (when applicable) are obtained utilizing NASCET criteria, using the distal internal carotid diameter as the denominator. CONTRAST:  268mMULTIHANCE GADOBENATE DIMEGLUMINE 529 MG/ML IV SOLN COMPARISON:  CT head without contrast 11/15/2016 FINDINGS: MRI HEAD FINDINGS Brain: Mild periventricular and subcortical white matter changes are present bilaterally. No acute infarct, hemorrhage, or mass lesion is present. The ventricles are of normal size. No significant extra-axial fluid collection is present. The internal auditory canals are within normal limits bilaterally. The brainstem and cerebellum are normal. Vascular: Flow is present in the major intracranial arteries. Skull and upper cervical spine: Skullbase is within normal limits. Midline sagittal structures are unremarkable. Craniocervical junction is normal. Marrow signal is within normal limits. Sinuses/Orbits: The paranasal sinuses and mastoid air cells are clear. Bilateral globes and orbits are within normal limits. MRA HEAD FINDINGS Focal signal loss  is present in the mid basilar artery suggesting a high-grade focal stenosis or embolus. There is flow in the distal basilar artery. Both posterior cerebral arteries originate from basilar tip. PCA branch vessels are  unremarkable. The internal carotid artery is are within normal limits from the skullbase through the ICA terminus. The left A1 segment is dominant. The anterior communicating artery is patent. ACA and MCA branch vessels are within normal limits bilaterally. MRA NECK FINDINGS Time-of-flight and enhance sequences demonstrate no significant flow disturbance at either carotid bifurcation. Flow is antegrade within the vertebral artery is bilaterally. A 3 vessel arch configuration is present. Scratched at there is a common origin of the left common carotid artery in the innominate artery. The right common carotid artery is within normal limits. Bifurcation is unremarkable. Cervical right ICA is normal. The left common carotid artery is within normal limits. The left carotid bifurcation is normal. The cervical left ICA is normal. The vertebral arteries are codominant. There is mild narrowing just proximal to the vertebrobasilar junction. 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 IMPRESSION: 1. 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 2. No acute infarct or ischemia. 3. Mild periventricular and subcortical white matter changes bilaterally likely reflect the sequela of chronic microvascular ischemia. Electronically Signed   By: San Morelle M.D.   On: 11/16/2016 15:03   Mr Jodene Nam Head Wo Contrast  Result Date: 11/16/2016 CLINICAL DATA:  Intermittent binocular diplopia EXAM: MRI HEAD WITHOUT CONTRAST MRA HEAD WITHOUT CONTRAST MRA NECK WITHOUT AND WITH CONTRAST TECHNIQUE: Multiplanar, multiecho pulse sequences of the brain and surrounding structures were obtained without intravenous contrast. Angiographic images of the Circle of Willis were obtained using MRA technique without intravenous contrast. Angiographic images of the neck were obtained using MRA technique without and with intravenous contrast. Carotid stenosis measurements (when applicable) are obtained  utilizing NASCET criteria, using the distal internal carotid diameter as the denominator. CONTRAST:  42m MULTIHANCE GADOBENATE DIMEGLUMINE 529 MG/ML IV SOLN COMPARISON:  CT head without contrast 11/15/2016 FINDINGS: MRI HEAD FINDINGS Brain: Mild periventricular and subcortical white matter changes are present bilaterally. No acute infarct, hemorrhage, or mass lesion is present. The ventricles are of normal size. No significant extra-axial fluid collection is present. The internal auditory canals are within normal limits bilaterally. The brainstem and cerebellum are normal. Vascular: Flow is present in the major intracranial arteries. Skull and upper cervical spine: Skullbase is within normal limits. Midline sagittal structures are unremarkable. Craniocervical junction is normal. Marrow signal is within normal limits. Sinuses/Orbits: The paranasal sinuses and mastoid air cells are clear. Bilateral globes and orbits are within normal limits. MRA HEAD FINDINGS Focal signal loss is present in the mid basilar artery suggesting a high-grade focal stenosis or embolus. There is flow in the distal basilar artery. Both posterior cerebral arteries originate from basilar tip. PCA branch vessels are unremarkable. The internal carotid artery is are within normal limits from the skullbase through the ICA terminus. The left A1 segment is dominant. The anterior communicating artery is patent. ACA and MCA branch vessels are within normal limits bilaterally. MRA NECK FINDINGS Time-of-flight and enhance sequences demonstrate no significant flow disturbance at either carotid bifurcation. Flow is antegrade within the vertebral artery is bilaterally. A 3 vessel arch configuration is present. Scratched at there is a common origin of the left common carotid artery in the innominate artery. The right common carotid artery is within normal limits. Bifurcation is unremarkable. Cervical right ICA is normal.  The left common carotid artery is  within normal limits. The left carotid bifurcation is normal. The cervical left ICA is normal. The vertebral arteries are codominant. There is mild narrowing just proximal to the vertebrobasilar junction. 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 IMPRESSION: 1. 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 2. No acute infarct or ischemia. 3. Mild periventricular and subcortical white matter changes bilaterally likely reflect the sequela of chronic microvascular ischemia. Electronically Signed   By: San Morelle M.D.   On: 11/16/2016 15:03    2D ECHO: Study Conclusions  - Left ventricle: The cavity size was normal. There was moderate   focal basal hypertrophy. Systolic function was normal. The   estimated ejection fraction was in the range of 60% to 65%. Wall   motion was normal; there were no regional wall motion   abnormalities. There was an increased relative contribution of   atrial contraction to ventricular filling. Doppler parameters are   consistent with abnormal left ventricular relaxation (grade 1   diastolic dysfunction). - Mitral valve: Calcified annulus. - Atrial septum: No defect or patent foramen ovale was identified. - Pulmonic valve: There was mild regurgitation.  Disposition and Follow-up: Discharge Instructions    Diet Carb Modified    Complete by:  As directed    Increase activity slowly    Complete by:  As directed        DISPOSITION: home    DISCHARGE FOLLOW-UP Follow-up Information    Raymond Late, MD. Schedule an appointment as soon as possible for a visit in 1 week(s).   Specialty:  Family Medicine Why:  Hospital follow up Contact information: [place] [address] [phone]        Rosalin Hawking, MD. Schedule an appointment as soon as possible for a visit in 6 week(s).   Specialty:  Neurology Contact information: [address] [place]  [id number] [phone]            Time spent on Discharge: 68mns   Signed:   REstill CottaM.D. [facility] 11/17/2016, 2:33 PM Pager: [phone]

## 2016-11-17 NOTE — Progress Notes (Signed)
STROKE TEAM PROGRESS NOTE   SUBJECTIVE (INTERVAL HISTORY) His wife is at the bedside.  Overall he feels his condition is completely resolved. He stated that he had 4 episodes of isolated vertical diplopia for the last 2 weeks. It could happen anytime of the day, lasting only 74min to 82min. No low BP at home and no other neuro deficit between episodes. 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    OBJECTIVE Temp:  [97.7 F (36.5 C)-98 F (36.7 C)] 98 F (36.7 C) (10/10 0619) Pulse Rate:  [73-88] 78 (10/10 1003) Cardiac Rhythm: Normal sinus rhythm (10/09 1930) Resp:  [18] 18 (10/10 1003) BP: (134-151)/(75-97) 136/97 (10/10 1003) SpO2:  [94 %-98 %] 98 % (10/10 1003)   Recent Labs Lab 11/16/16 1626 11/16/16 2142 11/17/16 0609 11/17/16 0902 11/17/16 1128  GLUCAP 74 135* 106* 206* 153*    Recent Labs Lab 11/10/16 1449 11/15/16 1704 11/15/16 1720 11/17/16 0642  NA 140 139 142 141  K 4.5 3.7 3.7 3.7  CL 103 108 106 105  CO2 29 23  --  29  GLUCOSE 137* 88 87 96  BUN 22 16 19  21*  CREATININE 1.09 0.88 0.80 1.03  CALCIUM 10.4 9.0  --  8.9    Recent Labs Lab 11/10/16 1449 11/15/16 1704  AST 16 20  ALT 28 23  ALKPHOS 83 77  BILITOT 1.1 1.4*  PROT 7.7 6.6  ALBUMIN 4.7 3.9    Recent Labs Lab 11/15/16 1704 11/15/16 1720 11/15/16 2110 11/17/16 0642  WBC 8.2  --  7.5 5.7  NEUTROABS 3.2  --   --   --   HGB 15.4 16.0 15.2 14.3  HCT 45.3 47.0 45.4 43.9  MCV 88.3  --  89.5 89.0  PLT 202  --  198 183   No results for input(s): CKTOTAL, CKMB, CKMBINDEX, TROPONINI in the last 168 hours.  Recent Labs  11/15/16 1704  LABPROT 12.6  INR 0.96    Recent Labs  11/15/16 1727  COLORURINE YELLOW  LABSPEC 1.035*  PHURINE 5.0  GLUCOSEU >=500*  HGBUR NEGATIVE  BILIRUBINUR NEGATIVE  KETONESUR NEGATIVE  PROTEINUR NEGATIVE  NITRITE NEGATIVE  LEUKOCYTESUR NEGATIVE       Component Value Date/Time   CHOL 145 11/16/2016 0422   TRIG 193 (H) 11/16/2016 0422   HDL 39  (L) 11/16/2016 0422   CHOLHDL 3.7 11/16/2016 0422   VLDL 39 11/16/2016 0422   LDLCALC 67 11/16/2016 0422   Lab Results  Component Value Date   HGBA1C 10.4 (H) 11/16/2016      Component Value Date/Time   LABOPIA NONE DETECTED 11/15/2016 1727   COCAINSCRNUR NONE DETECTED 11/15/2016 1727   LABBENZ NONE DETECTED 11/15/2016 1727   AMPHETMU NONE DETECTED 11/15/2016 1727   THCU NONE DETECTED 11/15/2016 1727   LABBARB NONE DETECTED 11/15/2016 1727     Recent Labs Lab 11/15/16 1704  ETH <10    I have personally reviewed the radiological images below and agree with the radiology interpretations.  Ct Angio Head and neck W Or Wo Contrast 11/16/2016 IMPRESSION: 1. Known critical proximal basilar stenosis. No visible posterior communicating arteries; the posterior circulation shows under/delayed opacification compared to the anterior circulation. 2. Moderate left vertebral atheromatous narrowing at the origin and V4 segment. Mild right vertebral origin stenosis. 3. Mild atherosclerotic changes in the anterior circulation without flow limiting stenosis. 4. Large stone near the proximal right submandibular duct with right submandibular gland atrophy. No acute inflammation.   Ct Head Wo  Contrast 11/15/2016 IMPRESSION: Normal brain    Mri brain and Mra Neck and head W Wo Contrast 11/16/2016 IMPRESSION: 1. 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 2. No acute infarct or ischemia. 3. Mild periventricular and subcortical white matter changes bilaterally likely reflect the sequela of chronic microvascular ischemia.   Carotid Doppler  Bilateral: 1-39% ICA stenosis. Vertebral artery flow is antegrade.  2D Echocardiogram   - Left ventricle: The cavity size was normal. There was moderate   focal basal hypertrophy. Systolic function was normal. The   estimated ejection fraction was in the range of 60% to 65%. Wall   motion was normal; there were no regional wall  motion   abnormalities. There was an increased relative contribution of   atrial contraction to ventricular filling. Doppler parameters are   consistent with abnormal left ventricular relaxation (grade 1   diastolic dysfunction). - Mitral valve: Calcified annulus. - Atrial septum: No defect or patent foramen ovale was identified. - Pulmonic valve: There was mild regurgitation.   PHYSICAL EXAM  Temp:  [97.7 F (36.5 C)-98 F (36.7 C)] 98 F (36.7 C) (10/10 0619) Pulse Rate:  [73-88] 78 (10/10 1003) Resp:  [18] 18 (10/10 1003) BP: (134-151)/(75-97) 136/97 (10/10 1003) SpO2:  [94 %-98 %] 98 % (10/10 1003)  General - Well nourished, well developed, in no apparent distress.  Ophthalmologic - Sharp disc margins OU.  Cardiovascular - Regular rate and rhythm with no murmur.  Neck - supple, no carotid bruits  Mental Status -  Level of arousal and orientation to time, place, and person were intact. Language including expression, naming, repetition, comprehension was assessed and found intact. Attention span and concentration were normal. Fund of Knowledge was assessed and was intact.  Cranial Nerves II - XII - II - Visual field intact OU. III, IV, VI - Extraocular movements intact. V - Facial sensation intact bilaterally. VII - Facial movement intact bilaterally. VIII - Hearing & vestibular intact bilaterally. X - Palate elevates symmetrically. XI - Chin turning & shoulder shrug intact bilaterally. XII - Tongue protrusion intact.  Motor Strength - The patient's strength was normal in all extremities and pronator drift was absent.  Bulk was normal and fasciculations were absent.   Motor Tone - Muscle tone was assessed at the neck and appendages and was normal.  Reflexes - The patient's reflexes were symmetrical in all extremities and he had no pathological reflexes.  Sensory - Light touch, temperature/pinprick were assessed and were symmetrical.    Coordination - The patient  had normal movements in the hands and feet with no ataxia or dysmetria.  Tremor was absent.  Gait and Station - The patient's transfers, posture, gait, station, and turns were observed as normal.   ASSESSMENT/PLAN [name] is a 71 y.o. male with history of CAD s/p stent, DM, HLD, HTN, OSA, prostate cancer admitted for 4 episodes of vertical diplopia. Symptoms resolved.    TIA with vertical diplopia - could be related to high grade BA stenosis. Risk factors include uncontrolled DM, HTN, HLD, OSA not on CPAP yet  MRI  No acute infarct  MRA  Head and neck - BA high grade stenosis  CTA head and neck - BA high grade stenosis, left VA atherosclerosis proximal to VBJ  2D Echo  EF 60-65%  LDL 67  UDS neg  HgbA1c 10.4  SCDs for VTE prophylaxis  Diet heart healthy/carb modified Room service appropriate? Yes; Fluid consistency: Thin  Diet  Carb Modified   aspirin 81 mg daily prior to admission, now on aspirin 325 mg daily and clopidogrel 75 mg daily. Continue DAPT for 3 months and then plavix alone.  Patient counseled to be compliant with his antithrombotic medications  Ongoing aggressive stroke risk factor management  Therapy recommendations:  none  Disposition:  Home today  Diabetes  HgbA1c 10.4 goal < 7.0  Uncontrolled  Currently on lantus  CBG monitoring  SSI  DM education and close endocrinology follow up  Hypertension  Home meds:   cozaar Permissive hypertension (OK if <220/120) for 24-48 hours post stroke and then gradually normalized within 5-7 days. Currently on no BP meds  Stable  Recommend BP goal 130-150 due to high grade BA stenosis  Avoid hypotension  Hyperlipidemia  Home meds:  Repatha and crestor   Currently statin resumed  LDL 67, goal < 70  Continue statin at discharge and outpt repatha treatment  OSA   Follows with Dr. Roxy Horseman at [facility]  Not on CPAP yet  Continue to follow up with Dr. Roxy Horseman  Other Stroke Risk  Factors  Advanced age  Obesity, Body mass index is 34.69 kg/m.   Other Active Problems  Recent move to [place] and needs setup medical services  Hospital day # 0  Neurology will sign off. Please call with questions. Pt will follow up with Dr. Roxy Horseman at [facility] on 01/05/17. Thanks for the consult.   Rosalin Hawking, MD PhD Stroke Neurology 11/17/2016 2:38 PM    To contact Stroke Continuity provider, please refer to [email or web address] After hours, contact General Neurology

## 2016-11-17 NOTE — Care Management Obs Status (Signed)
[place] NOTIFICATION   Patient Details  Name: Raymond Mays MRN: [id number] Date of Birth: 12-09-1945   Medicare Observation Status Notification Given:  Yes    Pollie Friar, RN 11/17/2016, 11:41 AM

## 2016-11-17 NOTE — Progress Notes (Signed)
Patient discharged home. Discharge instructions were reviewed with patient and wife. Patient and wife verbalized understanding.  

## 2016-11-17 NOTE — Care Management Note (Signed)
Case Management Note  Patient Details  Name: Raymond Mays MRN: [id number] Date of Birth: January 10, 1946  Subjective/Objective:                    Action/Plan: Pt discharging home with self care. Pt has PCP, insurance and transportation home. No further needs per CM.  Expected Discharge Date:  11/17/16               Expected Discharge Plan:  Home/Self Care  In-House Referral:     Discharge planning Services     Post Acute Care Choice:    Choice offered to:     DME Arranged:    DME Agency:     HH Arranged:    HH Agency:     Status of Service:  Completed, signed off  If discussed at [employer] of Stay Meetings, dates discussed:    Additional Comments:  Pollie Friar, RN 11/17/2016, 2:38 PM

## 2016-11-17 NOTE — Progress Notes (Signed)
  Speech Language Pathology  Patient Details Name: Raymond Mays MRN: [id number] DOB: [birth year] Today's Date: 11/17/2016 Time:  -     Chart reviewed. Pt reported he was at baseline with OT. PT and OT did not recommend further therapy. Per chart review, formal ST assessment not warranted.             Houston Siren 11/17/2016, 2:08 PM   Orbie Pyo Colvin Caroli.Ed [employer] [phone]

## 2016-11-23 ENCOUNTER — Telehealth: Payer: Self-pay | Admitting: Neurology

## 2016-11-23 LAB — ACETYLCHOLINE RECEPTOR AB, ALL
ACETYLCHOL BLOCK AB: 16 % (ref 0–25)
Acety choline binding ab: <0.03 nmol/L (ref 0.00–0.24)

## 2016-11-23 NOTE — Telephone Encounter (Signed)
Spoke to Dr Sandi Mariscal today, essentially working on ASV pre authorization, will see patient in office RV soon after he presented to ED. ED wants to work him up for involuntary movements.

## 2016-11-23 NOTE — Telephone Encounter (Signed)
I called the patient and spoke with the patient and his wife on her speaker phone. I informed them that at this time we are unable to know which treatment course is the best option. I explained what was found on the cpap and then bipap titration. I have made the pt aware that Dr Brett Fairy and Shirlean Mylar the sleep lab manager are looking at what we can try to do to help with treating his apnea. I explained that he should hear from Shirlean Mylar once we find out if he would be eligible for ASV. Pt verbalized understanding. I informed them that I have sent this information to his PCP and patient was ok with that. At this time the patient will wait to hear from [place] until we know how to proceed forward with treatment.

## 2016-11-23 NOTE — Telephone Encounter (Signed)
-----   Message from Larey Seat, MD sent at 11/23/2016 10:39 AM EDT ----- Dear Dr. Sandi Mariscal.  We are still perusing alternative treatment options to classic positive airway therapy.  My sleep lab manager is working on an exception for Mr. Head- will try to get him on ASV - due to CPAP and BiPAP - treatment emerging apnea.  Larey Seat, MD  Myriam Jacobson, please send him the note and copy to patient/ Pt's wife.

## 2016-11-24 LAB — MISC LABCORP TEST (SEND OUT): Labcorp test code: 504600

## 2016-12-06 ENCOUNTER — Other Ambulatory Visit: Payer: Self-pay | Admitting: Neurology

## 2016-12-06 DIAGNOSIS — G4733 Obstructive sleep apnea (adult) (pediatric): Secondary | ICD-10-CM

## 2016-12-09 ENCOUNTER — Other Ambulatory Visit: Payer: Self-pay

## 2016-12-09 ENCOUNTER — Telehealth: Payer: Self-pay | Admitting: Endocrinology

## 2016-12-09 MED ORDER — INSULIN LISPRO 100 UNIT/ML ~~LOC~~ SOLN: SUBCUTANEOUS | 3 refills | Status: DC

## 2016-12-09 NOTE — Telephone Encounter (Signed)
Called patient and spoke to his wife to let her know that I have sent the new prescription to the [facility] in [place]. She stated that will contact them to make sure they will fill it and if she has any trouble she will call us back.

## 2016-12-09 NOTE — Telephone Encounter (Signed)
I have sent a new prescription to [facility] for patient.

## 2016-12-09 NOTE — Telephone Encounter (Signed)
Patient need refill of Humalog, pharmacy said it was to soon, please advise she is totally out

## 2016-12-13 ENCOUNTER — Encounter: Payer: Self-pay | Admitting: Dietician

## 2016-12-13 ENCOUNTER — Encounter: Payer: Medicare Other | Attending: Endocrinology | Admitting: Dietician

## 2016-12-13 ENCOUNTER — Ambulatory Visit: Payer: Medicare Other | Admitting: Endocrinology

## 2016-12-13 ENCOUNTER — Encounter: Payer: Self-pay | Admitting: Endocrinology

## 2016-12-13 VITALS — BP 128/88 | HR 108 | Ht 73.0 in | Wt 264.8 lb

## 2016-12-13 DIAGNOSIS — Z713 Dietary counseling and surveillance: Secondary | ICD-10-CM | POA: Insufficient documentation

## 2016-12-13 DIAGNOSIS — E1165 Type 2 diabetes mellitus with hyperglycemia: Secondary | ICD-10-CM | POA: Diagnosis not present

## 2016-12-13 DIAGNOSIS — E118 Type 2 diabetes mellitus with unspecified complications: Secondary | ICD-10-CM

## 2016-12-13 DIAGNOSIS — Z794 Long term (current) use of insulin: Secondary | ICD-10-CM

## 2016-12-13 LAB — GLUCOSE, POCT (MANUAL RESULT ENTRY): POC GLUCOSE: 197 mg/dL — AB (ref 70–99)

## 2016-12-13 MED ORDER — SEMAGLUTIDE(0.25 OR 0.5MG/DOS) 2 MG/1.5ML ~~LOC~~ SOPN: 0.5000 mg | PEN_INJECTOR | SUBCUTANEOUS | 2 refills | Status: DC

## 2016-12-13 NOTE — Patient Instructions (Signed)
Great job reducing your sweet intake! Increase your non starchy vegetable intake. Lean meat and portions the size of a deck of cards. Consider reading labels for sodium, fat, and carbohydrates  Calorie Edison Pace app   Be mindful of choices eaten out.  Aim for 4 Carb Choices per meal (60 grams) +/- 1 either way  Aim for 0-1 Carbs per snack if hungry  Include protein in moderation with your meals and snacks Consider reading food labels for Total Carbohydrate and Fat Grams of foods Continue checking BG at alternate times per day as directed by MD   Free Style Elenor Legato could be an option Continue taking medication as directed by MD

## 2016-12-13 NOTE — Patient Instructions (Addendum)
Check blood sugars on waking up 4/7 days  Also check blood sugars about 2 hours after a meal and do this after different meals by rotation  Recommended blood sugar levels on waking up is 90-130 and about 2 hours after meal is 130-160  Please bring your blood sugar monitor to each visit, thank you  Tresiba 60 units and adjust every 3 days

## 2016-12-13 NOTE — Progress Notes (Signed)
Diabetes Self-Management Education  Visit Type: First/Initial  Appt. Start Time: 1100 Appt. End Time: 7035  12/13/2016  [name], identified by name and date of birth, is a 71 y.o. male with a diagnosis of Diabetes: Type 2. Other history includes OSA and currently is not using C-pap and is being evaluated for a mask that works for him.  He also has CAD with stent, history of prostate cancer, depression (score of 15 today with no thoughts of self harm), HTN, hyperlipidemia, neuropathy.  He had a recent incident of double vision and was hospitalized for 3 days.  Concerns that this was a light stroke.  Concerns of hypoglycemia in the office but CBG of 199.  He does not answer questions correctly at times and his wife who is here today will correct him.  Question of some memory issues.  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  He has decreased his sweet intake since the double vision about a month ago.  He had steroid injections in his knee in July.  Medication includes:  Humalog 18 units before meals, Tresiba (U-200)- 52 units daily, Invokamet, Exenatide.  Patient lives with his wife.  They recently moved from [place] from [place].  He is originally from [place].  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  They eat out often as he enjoys this and wife feels that he needs to get out of the house.  His wife eats a vegetarian diet but patient prefers to eat meat. Barriers include health problems prohibiting exercise, question of memory issues, desire to change.  ASSESSMENT  Height 6\' 1"  (1.854 m), weight 263 lb (119.3 kg). Body mass index is 34.7 kg/m.  Diabetes Self-Management Education - 12/13/16 1124      Visit Information   Visit Type  First/Initial      Initial Visit   Diabetes Type  Type 2    Are you currently following a meal plan?  No    Are you taking your  medications as prescribed?  Yes    Date Diagnosed  2015      Health Coping   How would you rate your overall health?  Fair      Psychosocial Assessment   Patient Belief/Attitude about Diabetes  Afraid    Self-care barriers  Other (comment) depression   depression   Self-management support  Doctor's office;Family    Other persons present  Patient;Spouse/SO    Patient Concerns  Nutrition/Meal planning;Glycemic Control;Weight Control    Special Needs  None    Preferred Learning Style  No preference indicated    Learning Readiness  Ready    How often do you need to have someone help you when you read instructions, pamphlets, or other written materials from your doctor or pharmacy?  1 - Never    What is the last grade level you completed in school?  post graduate school      Pre-Education Assessment   Patient understands the diabetes disease and treatment process.  Needs Review    Patient understands incorporating nutritional management into lifestyle.  Needs Review    Patient undertands incorporating physical activity into lifestyle.  Needs Review    Patient understands using medications safely.  Needs Review    Patient understands monitoring blood glucose, interpreting and using results  Needs Review    Patient understands prevention,  detection, and treatment of acute complications.  Needs Review    Patient understands prevention, detection, and treatment of chronic complications.  Needs Review    Patient understands how to develop strategies to address psychosocial issues.  Needs Review    Patient understands how to develop strategies to promote health/change behavior.  Needs Review      Complications   Last HgB A1C per patient/outside source  10.4 % 11/2016 and increased from 04/12/16 of 10.3%   11/2016 and increased from 04/12/16 of 10.3%   How often do you check your blood sugar?  3-4 times/day    Fasting Blood glucose range (mg/dL)  180-200    Postprandial Blood glucose range (mg/dL)   180-200    Number of hypoglycemic episodes per month  2    Can you tell when your blood sugar is low?  Yes    What do you do if your blood sugar is low?  regular soda and hard candy    Number of hyperglycemic episodes per week  14    Can you tell when your blood sugar is high?  No    Have you had a dilated eye exam in the past 12 months?  Yes    Have you had a dental exam in the past 12 months?  Yes    Are you checking your feet?  Yes    How many days per week are you checking your feet?  7      Dietary Intake   Breakfast  cold cereal (Kashi shreeded wheat) and skim milk OR plain instant oatmeal with cinnamon OR at least 3 eggs and 2-3 slices [employer] toast with butter/canola oil blend AND coffee with cream    Snack (morning)  none    Lunch  leftovers (brown rice, beans, tomatoes) OR grilled cheese OR cheeseburger or club sandwich out to eat    Snack (afternoon)  "anything I can find"    Dinner  salmon or occasional steak, vegetables, potato or rice, occasional salad OR pizza OR [place] OR [place] Out to eat most days   Out to eat most days   Snack (evening)  usually none    Beverage(s)  water, diet Dr. Malachi Bonds, unsweetened iced tea, coffee with cream      Exercise   Exercise Type  ADL's    How many days per week to you exercise?  0    How many minutes per day do you exercise?  0    Total minutes per week of exercise  0      Patient Education   Previous Diabetes Education  Yes (please comment) a couple of times in [facility] with RD   a couple of times in [place] with RD   Nutrition management   Role of diet in the treatment of diabetes and the relationship between the three main macronutrients and blood glucose level;Meal timing in regards to the patients' current diabetes medication.;Information on hints to eating out and maintain blood glucose control.;Meal options for control of blood glucose level and chronic complications.;Food label reading, portion sizes and measuring food.     Medications  Reviewed patients medication for diabetes, action, purpose, timing of dose and side effects.    Monitoring  Purpose and frequency of SMBG.;Identified appropriate SMBG and/or A1C goals.;Daily foot exams;Yearly dilated eye exam    Acute complications  Taught treatment of hypoglycemia - the 15 rule.    Chronic complications  Relationship between chronic complications and blood glucose control  Psychosocial adjustment  Worked with patient to identify barriers to care and solutions;Identified and addressed patients feelings and concerns about diabetes;Role of stress on diabetes    Personal strategies to promote health  Lifestyle issues that need to be addressed for better diabetes care      Individualized Goals (developed by patient)   Nutrition  Follow meal plan discussed    Physical Activity  Not Applicable    Medications  take my medication as prescribed    Monitoring   test my blood glucose as discussed    Reducing Risk  do foot checks daily;increase portions of nuts and seeds    Health Coping  discuss diabetes with (comment) MD/RD   MD/RD     Post-Education Assessment   Patient understands the diabetes disease and treatment process.  Demonstrates understanding / competency    Patient understands incorporating nutritional management into lifestyle.  Demonstrates understanding / competency    Patient undertands incorporating physical activity into lifestyle.  Demonstrates understanding / competency    Patient understands using medications safely.  Demonstrates understanding / competency    Patient understands monitoring blood glucose, interpreting and using results  Demonstrates understanding / competency    Patient understands prevention, detection, and treatment of acute complications.  Demonstrates understanding / competency    Patient understands prevention, detection, and treatment of chronic complications.  Demonstrates understanding / competency    Patient understands  how to develop strategies to address psychosocial issues.  Demonstrates understanding / competency    Patient understands how to develop strategies to promote health/change behavior.  Demonstrates understanding / competency      Outcomes   Expected Outcomes  Other (comment) demonstrated interest in learning but question change   demonstrated interest in learning but question change   Future DMSE  PRN    Program Status  Completed       Individualized Plan for Diabetes Self-Management Training:   Learning Objective:  Patient will have a greater understanding of diabetes self-management. Patient education plan is to attend individual and/or group sessions per assessed needs and concerns.   Plan:   Patient Instructions  Doristine Devoid job reducing your sweet intake! Increase your non starchy vegetable intake. Lean meat and portions the size of a deck of cards. Consider reading labels for sodium, fat, and carbohydrates  Calorie Edison Pace app   Be mindful of choices eaten out.  Aim for 4 Carb Choices per meal (60 grams) +/- 1 either way  Aim for 0-1 Carbs per snack if hungry  Include protein in moderation with your meals and snacks Consider reading food labels for Total Carbohydrate and Fat Grams of foods Continue checking BG at alternate times per day as directed by MD   Free Style Libre could be an option Continue taking medication as directed by MD       Expected Outcomes:  Other (comment)(demonstrated interest in learning but question change)  Education material provided: Living Well with Diabetes, Food label handouts, A1C conversion sheet, Meal plan card, My Plate and Snack sheet, dining out with diabetes, low sodium seasoning tips, plant based eating.  If problems or questions, patient to contact team via:  Phone  Future DSME appointment: PRN

## 2016-12-13 NOTE — Progress Notes (Signed)
Patient ID: Raymond Mays, male   DOB: [birth year], 71 y.o.   MRN: [id number]          Reason for Appointment:  for Type 2 Diabetes  Referring physician: Blomgren   History of Present Illness:          Date of diagnosis of type 2 diabetes mellitus:  2015      Background history:   The patient is a poor historian and is not able to give chronology of his diabetes diagnosis and treatment Records from PCP indicate that he was diagnosed in 2015 but previous treatment is unknown He was apparently taking Actos, Invokamet and Bydureon in 05/2015 when his endocrinologist started him on insulin with a glucose of 326 Subsequently A1c has ranged between 9-10 0.7  Recent history:   INSULIN regimen is: Tresiba 52 units qd. Humalog 18 at Bfst/ lunch -20 at dinner    Non-insulin hypoglycemic drugs the patient is taking are:Invokamet XR, 50/500, 2 daily, Bydureon  Current management, blood sugar patterns and problems identified:  Patient is still checking his blood sugar with a [employer] brand meter  He did not bring his monitor for download or his home record today  He is also somewhat unclear what his blood sugars are running but he thinks they are inconsistent  Mealtime insulin: He is again sometimes forgetting to take it before eating and asking whether it can be taken after eating  He is still not sure about possibly going on insulin pump which was discussed on the last visit and he has misplaced the brochure that was given  Today he is asking about the continuous glucose monitoring  TRESIBA was started only a few weeks ago but even though he is taking 52 units he still thinks his fasting blood sugars are mostly over 200  No recent labs available for fructosamine level  He may be having higher readings after evening meal compared to after breakfast and lunch  He has been referred to see the dietitian and is going to see her today        Side effects from medications have been:  None  Compliance with the medical regimen: Fair Hypoglycemia:   none  Glucose monitoring:  done up to 3 times a day         Glucometer: [place]ion       Blood Glucose readings by time of day and averages from home record  Mean values apply above for all meters except median for One Touch  PRE-MEAL Fasting Lunch Dinner Bedtime Overall  Glucose range: 200+ ?   ?   ?  198   Mean/median:        Self-care: The diet that the patient has been following is: tries to limit Fatty foods, occasionally will have regular soft drinks.      Typical meal intake: Breakfast is cereal  or oatmeal or eggs              Dietician visit, most recent: 11/18               Exercise: walks some, limited by knee pain   Weight history: same  Wt Readings from Last 3 Encounters:  12/13/16 264 lb 12.8 oz (120.1 kg)  12/13/16 263 lb (119.3 kg)  11/15/16 262 lb 14.4 oz (119.3 kg)    Glycemic control:      Lab Results  Component Value Date   HGBA1C 10.4 (H) 11/16/2016   HGBA1C 10.2 11/10/2016   HGBA1C 9.8  04/12/2016   Lab Results  Component Value Date   MICROALBUR 19.2 (H) 11/10/2016   LDLCALC 67 11/16/2016   CREATININE 1.03 11/17/2016   Lab Results  Component Value Date   MICRALBCREAT 7.2 11/10/2016    No results found for: FRUCTOSAMINE    Allergies as of 12/13/2016      Reactions   Percocet [oxycodone-acetaminophen] Itching      Medication List        Accurate as of 12/13/16  2:59 PM. Always use your most recent med list.          allopurinol 300 MG tablet Commonly known as:  ZYLOPRIM Take 300 mg by mouth daily.   aspirin 325 MG EC tablet Take 1 tablet (325 mg total) by mouth daily. Please continue Aspirin and plavix for 3 months, then plavix alone   buPROPion 150 MG 12 hr tablet Commonly known as:  WELLBUTRIN SR Take 150 mg by mouth daily.   clopidogrel 75 MG tablet Commonly known as:  PLAVIX Take 1 tablet (75 mg total) by mouth daily.   Exenatide ER 2 MG Pen Inject into  the skin.   insulin lispro 100 UNIT/ML injection Commonly known as:  HUMALOG Inject 18 units at breakfast, inject 18 units at lunch, and inject 20 units at dinner.   INVOKAMET XR 150-500 MG Tb24 Generic drug:  Canagliflozin-Metformin HCl ER Take 2 tablets by mouth daily.   losartan 50 MG tablet Commonly known as:  COZAAR Take 50 mg by mouth daily.   NONFORMULARY OR COMPOUNDED ITEM Apply 1 application topically 3 (three) times daily as needed (MUSCLE SORENESS). 5,000-LIDOCAINE 5% BASE GEL IN A PUMP   omeprazole 40 MG capsule Commonly known as:  PRILOSEC Take 20 mg by mouth 2 (two) times daily.   PROAIR HFA 108 (90 Base) MCG/ACT inhaler Generic drug:  albuterol Inhale 1-2 puffs into the lungs every 6 (six) hours as needed for wheezing.   REPATHA 140 MG/ML Sosy Generic drug:  Evolocumab Inject 140 mg into the skin every 14 (fourteen) days.   rosuvastatin 10 MG tablet Commonly known as:  CRESTOR Take 10 mg by mouth daily.   Semaglutide 0.25 or 0.5 MG/DOSE Sopn Commonly known as:  OZEMPIC Inject 0.5 mg once a week into the skin.   TRESIBA FLEXTOUCH 200 UNIT/ML Sopn Generic drug:  Insulin Degludec Tresiba FlexTouch U-200 insulin 200 unit/mL (3 mL) subcutaneous pen  Inject 50 units every day by subcutaneous route.   venlafaxine 75 MG tablet Commonly known as:  EFFEXOR Take 75 mg by mouth 2 (two) times daily with a meal.       Allergies:  Allergies  Allergen Reactions  . Percocet [Oxycodone-Acetaminophen] Itching    Past Medical History:  Diagnosis Date  . Alcohol abuse   . CAD (coronary artery disease)    Stent to circ 2007, patent on cath 2015.  NL EF  . Cancer [facility])    prostate  . Depression   . Diabetes mellitus without complication ([place])   . Hyperlipemia   . Hypertension   . Neuropathy   . Sleep apnea     Past Surgical History:  Procedure Laterality Date  . CHOLECYSTECTOMY    . COLONOSCOPY    . heart stent    . KNEE SURGERY     bakers cyst  .  PROSTATECTOMY    . TONSILLECTOMY      Family History  Problem Relation Age of Onset  . Heart attack Mother  Died 80s  . Prostate cancer Father   . Diabetes Father     Social History:  reports that  has never smoked. he has never used smokeless tobacco. He reports that he does not drink alcohol or use drugs.   Review of Systems Lipid history: LDL particle number in 2017 was 720 He has been on Crestor 10 mg daily, LDL below 70  Has previous history of CAD    Lab Results  Component Value Date   CHOL 145 11/16/2016   HDL 39 (L) 11/16/2016   LDLCALC 67 11/16/2016   LDLDIRECT 133.0 11/10/2016   TRIG 193 (H) 11/16/2016   CHOLHDL 3.7 11/16/2016           Hypertension: Is being treated with losartan 50 mg  Most recent eye exam was 1 year ago, reportedly no retinopathy  Most recent foot exam: 10/18 He does see a podiatrist periodically     Physical Examination:  BP 128/88   Pulse (!) 108   Ht 6\' 1"  (1.854 m)   Wt 264 lb 12.8 oz (120.1 kg)   SpO2 96%   BMI 34.94 kg/m        ASSESSMENT:  Diabetes type 2, uncontrolled with obesity   His A1c has been consistently around 10%  See history of present illness for detailed discussion of current diabetes management, blood sugar patterns and problems identified  He is somewhat insulin resistant but is still taking only about 100 units of insulin a day for his weight of 120 kg Not clear if he is benefiting from [place], not losing weight  He is going to see the dietitian and hopefully may change his diet somewhat to improve his blood sugars and improve his weight Currently not exercising and may not be able to do any in the short-term because of his recent TIA   PLAN:     He will start increasing his TRESIBA with the flow sheet provided with detailed instructions  He will go to 60 units now and increase this every 3 days by 4 units until blood sugars are below 130  Most likely he needs higher  doses of Humalog at dinnertime but need to see his blood sugars before changing this  Also discussed use of Ozempic instead of BYDUREON for better efficacy  Showed him how to use the Ozempic pen and will send a prescription to see if it is covered  He will start with 0.25 mg for the first 2 weeks and then 0.5 mg  Meanwhile he will start looking at the freestyle [place] sensor and discussed how this works, Programmer, systems given.  He will check on insurance coverage for this.  Again given information on the Omnipod insulin pump and showed him, this would be useful especially with ability to adjust his insulin at mealtimes and possibly increase basal rate overnight for fasting hyperglycemia  He will hopefully start exercise also  Will need to check fructosamine and repeat renal function on the next visit  He does need to take Humalog more consistently before meals    Consultation with dietitian for meal planning today  Patient Instructions  Check blood sugars on waking up 4/7 days  Also check blood sugars about 2 hours after a meal and do this after different meals by rotation  Recommended blood sugar levels on waking up is 90-130 and about 2 hours after meal is 130-160  Please bring your blood sugar monitor to each visit, thank you  Tresiba 60 units  and adjust every 3 days     Consultation note has been sent to the referring physician  Counseling time on subjects discussed in assessment and plan sections is over 50% of today's 60 minute visit   Raymond Mays 12/13/2016, 2:59 PM   Note: This office note was prepared with Dragon voice recognition system technology. Any transcriptional errors that result from this process are unintentional.

## 2016-12-14 ENCOUNTER — Telehealth: Payer: Self-pay | Admitting: *Deleted

## 2016-12-14 ENCOUNTER — Telehealth: Payer: Self-pay

## 2016-12-14 ENCOUNTER — Other Ambulatory Visit: Payer: Self-pay

## 2016-12-14 MED ORDER — CANAGLIFLOZIN-METFORMIN HCL ER 150-500 MG PO TB24: 2.0000 | ORAL_TABLET | Freq: Every day | ORAL | 3 refills | Status: DC

## 2016-12-14 MED ORDER — INSULIN LISPRO 100 UNIT/ML (KWIKPEN): PEN_INJECTOR | SUBCUTANEOUS | 3 refills | Status: DC

## 2016-12-14 NOTE — Telephone Encounter (Signed)
Please advise if okay to fill Invokamet? This is the encounter I need back. Thanks!

## 2016-12-14 NOTE — Telephone Encounter (Signed)
His Invokamet has been continued so you can send it.  Also find out if Ozempic has been covered

## 2016-12-14 NOTE — Telephone Encounter (Signed)
Please advise if okay to refill Invokamet?

## 2016-12-14 NOTE — Telephone Encounter (Signed)
Patient wife Jeani Hawking called and states the patient needs a refill of his Invokamet and also the Humalog Quick Pen. The patient went to the pharmacy and was given Humalog vials and they sent it back . His pharmacy is [facility] on [address]  [phone] ) .Please Advise .Thank you

## 2016-12-14 NOTE — Telephone Encounter (Signed)
Called Jeani Hawking and let her know that I have sent over the Humalog Kwikpens and the Invokamet to the pharmacy. I also asked about the Ozempic and they stated that his insurance will not cover this medication.

## 2016-12-17 ENCOUNTER — Other Ambulatory Visit: Payer: Self-pay

## 2016-12-23 ENCOUNTER — Ambulatory Visit (HOSPITAL_COMMUNITY): Payer: Medicare Other | Admitting: Psychiatry

## 2016-12-27 ENCOUNTER — Ambulatory Visit (INDEPENDENT_AMBULATORY_CARE_PROVIDER_SITE_OTHER): Payer: Medicare Other | Admitting: Neurology

## 2016-12-27 DIAGNOSIS — F32A Depression, unspecified: Secondary | ICD-10-CM | POA: Insufficient documentation

## 2016-12-27 DIAGNOSIS — F1010 Alcohol abuse, uncomplicated: Secondary | ICD-10-CM | POA: Insufficient documentation

## 2016-12-27 DIAGNOSIS — E785 Hyperlipidemia, unspecified: Secondary | ICD-10-CM | POA: Insufficient documentation

## 2016-12-27 DIAGNOSIS — G4733 Obstructive sleep apnea (adult) (pediatric): Secondary | ICD-10-CM

## 2016-12-27 DIAGNOSIS — C801 Malignant (primary) neoplasm, unspecified: Secondary | ICD-10-CM | POA: Insufficient documentation

## 2016-12-27 DIAGNOSIS — G4731 Primary central sleep apnea: Secondary | ICD-10-CM

## 2016-12-27 DIAGNOSIS — I10 Essential (primary) hypertension: Secondary | ICD-10-CM | POA: Insufficient documentation

## 2016-12-27 DIAGNOSIS — F329 Major depressive disorder, single episode, unspecified: Secondary | ICD-10-CM | POA: Insufficient documentation

## 2016-12-27 DIAGNOSIS — G629 Polyneuropathy, unspecified: Secondary | ICD-10-CM | POA: Insufficient documentation

## 2016-12-27 DIAGNOSIS — E119 Type 2 diabetes mellitus without complications: Secondary | ICD-10-CM | POA: Insufficient documentation

## 2016-12-27 DIAGNOSIS — G4730 Sleep apnea, unspecified: Secondary | ICD-10-CM | POA: Insufficient documentation

## 2016-12-27 DIAGNOSIS — I2510 Atherosclerotic heart disease of native coronary artery without angina pectoris: Secondary | ICD-10-CM | POA: Insufficient documentation

## 2016-12-27 DIAGNOSIS — R063 Periodic breathing: Secondary | ICD-10-CM

## 2016-12-29 ENCOUNTER — Encounter: Payer: Self-pay | Admitting: Sports Medicine

## 2016-12-29 ENCOUNTER — Ambulatory Visit: Payer: Medicare Other | Admitting: Sports Medicine

## 2016-12-29 DIAGNOSIS — M79674 Pain in right toe(s): Secondary | ICD-10-CM | POA: Diagnosis not present

## 2016-12-29 DIAGNOSIS — M79675 Pain in left toe(s): Secondary | ICD-10-CM

## 2016-12-29 DIAGNOSIS — M2041 Other hammer toe(s) (acquired), right foot: Secondary | ICD-10-CM

## 2016-12-29 DIAGNOSIS — B351 Tinea unguium: Secondary | ICD-10-CM | POA: Diagnosis not present

## 2016-12-29 DIAGNOSIS — L84 Corns and callosities: Secondary | ICD-10-CM

## 2016-12-29 DIAGNOSIS — M2042 Other hammer toe(s) (acquired), left foot: Secondary | ICD-10-CM

## 2016-12-29 DIAGNOSIS — E1142 Type 2 diabetes mellitus with diabetic polyneuropathy: Secondary | ICD-10-CM | POA: Diagnosis not present

## 2016-12-29 NOTE — Progress Notes (Signed)
Subjective: Raymond Mays is a 71 y.o. male patient with history of diabetes who returns to office today complaining of long, painful nails and callus while ambulating in shoes; unable to trim. Patient states that the glucose reading this morning was not recorded. Last check was 2 days ago was 150. Patient admits started on plavix 1 week ago. Denies any other issues.   Patient Active Problem List   Diagnosis Date Noted  . Sleep apnea   . Neuropathy   . Hypertension   . Hyperlipemia   . Diabetes mellitus without complication ([place])   . Depression   . Cancer ([place])   . CAD (coronary artery disease)   . Alcohol abuse   . Controlled diabetes mellitus type 2 with complications ([place])   . Gastroesophageal reflux disease   . TIA (transient ischemic attack) 11/15/2016  . Diplopia 11/15/2016  . Type 2 diabetes mellitus with complication, with long-term current use of insulin ([place]) 10/08/2016  . Coronary artery disease involving native coronary artery of native heart without angina pectoris 10/08/2016  . SOB (shortness of breath) 10/08/2016  . Excessive daytime sleepiness 09/30/2016  . Severe episode of recurrent major depressive disorder, without psychotic features ([place]) 09/30/2016  . Syncope, near 09/30/2016  . OSA (obstructive sleep apnea) 09/30/2016   Current Outpatient Medications on File Prior to Visit  Medication Sig Dispense Refill  . albuterol (PROAIR HFA) 108 (90 Base) MCG/ACT inhaler Inhale 1-2 puffs into the lungs every 6 (six) hours as needed for wheezing.    Marland Kitchen allopurinol (ZYLOPRIM) 300 MG tablet Take 300 mg by mouth daily.    Marland Kitchen aspirin EC 325 MG EC tablet Take 1 tablet (325 mg total) by mouth daily. Please continue Aspirin and plavix for 3 months, then plavix alone 30 tablet 3  . buPROPion (WELLBUTRIN SR) 150 MG 12 hr tablet Take 150 mg by mouth daily.     . Canagliflozin-Metformin HCl ER (INVOKAMET XR) 150-500 MG TB24 Take 2 tablets daily by mouth. 60 tablet 3  . clopidogrel  (PLAVIX) 75 MG tablet Take 1 tablet (75 mg total) by mouth daily. 30 tablet 5  . Evolocumab (REPATHA) 140 MG/ML SOSY Inject 140 mg into the skin every 14 (fourteen) days.    . Exenatide ER 2 MG PEN Inject into the skin.    . Insulin Degludec (TRESIBA FLEXTOUCH) 200 UNIT/ML SOPN Tresiba FlexTouch U-200 insulin 200 unit/mL (3 mL) subcutaneous pen  Inject 50 units every day by subcutaneous route.    . insulin lispro (HUMALOG KWIKPEN) 100 UNIT/ML KiwkPen Inject 18 units at breakfast, inject 18 units at lunch, and inject 20 units at dinner. 15 mL 3  . insulin lispro (HUMALOG) 100 UNIT/ML injection Inject 18 units at breakfast, inject 18 units at lunch, and inject 20 units at dinner. 20 mL 3  . losartan (COZAAR) 50 MG tablet Take 50 mg by mouth daily.    . NONFORMULARY OR COMPOUNDED ITEM Apply 1 application topically 3 (three) times daily as needed (MUSCLE SORENESS). 5,000-LIDOCAINE 5% BASE GEL IN A PUMP    . omeprazole (PRILOSEC) 40 MG capsule Take 20 mg by mouth 2 (two) times daily.     . rosuvastatin (CRESTOR) 10 MG tablet Take 10 mg by mouth daily.    . Semaglutide (OZEMPIC) 0.25 or 0.5 MG/DOSE SOPN Inject 0.5 mg once a week into the skin. 1 pen 2  . venlafaxine (EFFEXOR) 75 MG tablet Take 75 mg by mouth 2 (two) times daily with a meal.  No current facility-administered medications on file prior to visit.    Allergies  Allergen Reactions  . Percocet [Oxycodone-Acetaminophen] Itching    Recent Results (from the past 2160 hour(s))  B Nat Peptide     Status: None   Collection Time: 10/07/16 12:24 PM  Result Value Ref Range   Brain Natriuretic Peptide 7.0 <100 pg/mL    Comment:   BNP levels increase with age in the general population with the highest values seen in individuals greater than 16 years of age. Reference: Joellyn Rued Cardiol 2002; 97:741-42.     Myocardial Perfusion Imaging     Status: None   Collection Time: 10/20/16  3:29 PM  Result Value Ref Range   Rest HR 93 bpm   Rest  BP 147/105 mmHg   Peak HR 107 bpm   Peak BP 147/105 mmHg   SSS 2    SRS 1    SDS 1    TID 1.13    LV sys vol 37 mL   LV dias vol 80 62 - 150 mL  Comprehensive metabolic panel     Status: Abnormal   Collection Time: 11/10/16  2:49 PM  Result Value Ref Range   Sodium 140 135 - 145 mEq/L   Potassium 4.5 3.5 - 5.1 mEq/L   Chloride 103 96 - 112 mEq/L   CO2 29 19 - 32 mEq/L   Glucose, Bld 137 (H) 70 - 99 mg/dL   BUN 22 6 - 23 mg/dL   Creatinine, Ser 1.09 0.40 - 1.50 mg/dL   Total Bilirubin 1.1 0.2 - 1.2 mg/dL   Alkaline Phosphatase 83 39 - 117 U/L   AST 16 0 - 37 U/L   ALT 28 0 - 53 U/L   Total Protein 7.7 6.0 - 8.3 g/dL   Albumin 4.7 3.5 - 5.2 g/dL   Calcium 10.4 8.4 - 10.5 mg/dL   GFR 70.95 >60.00 mL/min  Microalbumin / creatinine urine ratio     Status: Abnormal   Collection Time: 11/10/16  2:49 PM  Result Value Ref Range   Microalb, Ur 19.2 (H) 0.0 - 1.9 mg/dL   Creatinine,U 266.1 mg/dL   Microalb Creat Ratio 7.2 0.0 - 30.0 mg/g  Lipid panel     Status: Abnormal   Collection Time: 11/10/16  2:49 PM  Result Value Ref Range   Cholesterol 208 (H) 0 - 200 mg/dL    Comment: ATP III Classification       Desirable:  < 200 mg/dL               Borderline High:  200 - 239 mg/dL          High:  > = 240 mg/dL   Triglycerides 206.0 (H) 0.0 - 149.0 mg/dL    Comment: Normal:  <150 mg/dLBorderline High:  150 - 199 mg/dL   HDL 46.70 >39.00 mg/dL   VLDL 41.2 (H) 0.0 - 40.0 mg/dL   Total CHOL/HDL Ratio 4     Comment:                Men          Women1/2 Average Risk     3.4          3.3Average Risk          5.0          4.42X Average Risk          9.6  7.13X Average Risk          15.0          11.0                       NonHDL 161.55     Comment: NOTE:  Non-HDL goal should be 30 mg/dL higher than patient's LDL goal (i.e. LDL goal of < 70 mg/dL, would have non-HDL goal of < 100 mg/dL)  LDL cholesterol, direct     Status: None   Collection Time: 11/10/16  2:49 PM  Result Value Ref  Range   Direct LDL 133.0 mg/dL    Comment: Optimal:  <100 mg/dLNear or Above Optimal:  100-129 mg/dLBorderline High:  130-159 mg/dLHigh:  160-189 mg/dLVery High:  >190 mg/dL  POCT glycosylated hemoglobin (Hb A1C)     Status: None   Collection Time: 11/10/16  3:02 PM  Result Value Ref Range   Hemoglobin A1C 10.2   Ethanol     Status: None   Collection Time: 11/15/16  5:04 PM  Result Value Ref Range   Alcohol, Ethyl (B) <10 <10 mg/dL    Comment:        LOWEST DETECTABLE LIMIT FOR SERUM ALCOHOL IS 10 mg/dL FOR MEDICAL PURPOSES ONLY Please note change in reference range.   Protime-INR     Status: None   Collection Time: 11/15/16  5:04 PM  Result Value Ref Range   Prothrombin Time 12.6 11.4 - 15.2 seconds   INR 0.96   APTT     Status: None   Collection Time: 11/15/16  5:04 PM  Result Value Ref Range   aPTT 26 24 - 36 seconds  CBC     Status: None   Collection Time: 11/15/16  5:04 PM  Result Value Ref Range   WBC 8.2 4.0 - 10.5 K/uL   RBC 5.13 4.22 - 5.81 MIL/uL   Hemoglobin 15.4 13.0 - 17.0 g/dL   HCT 45.3 39.0 - 52.0 %   MCV 88.3 78.0 - 100.0 fL   MCH 30.0 26.0 - 34.0 pg   MCHC 34.0 30.0 - 36.0 g/dL   RDW 14.3 11.5 - 15.5 %   Platelets 202 150 - 400 K/uL  Differential     Status: None   Collection Time: 11/15/16  5:04 PM  Result Value Ref Range   Neutrophils Relative % 39 %   Neutro Abs 3.2 1.7 - 7.7 K/uL   Lymphocytes Relative 47 %   Lymphs Abs 3.8 0.7 - 4.0 K/uL   Monocytes Relative 11 %   Monocytes Absolute 0.9 0.1 - 1.0 K/uL   Eosinophils Relative 3 %   Eosinophils Absolute 0.3 0.0 - 0.7 K/uL   Basophils Relative 0 %   Basophils Absolute 0.0 0.0 - 0.1 K/uL  Comprehensive metabolic panel     Status: Abnormal   Collection Time: 11/15/16  5:04 PM  Result Value Ref Range   Sodium 139 135 - 145 mmol/L   Potassium 3.7 3.5 - 5.1 mmol/L   Chloride 108 101 - 111 mmol/L   CO2 23 22 - 32 mmol/L   Glucose, Bld 88 65 - 99 mg/dL   BUN 16 6 - 20 mg/dL   Creatinine, Ser  0.88 0.61 - 1.24 mg/dL   Calcium 9.0 8.9 - 10.3 mg/dL   Total Protein 6.6 6.5 - 8.1 g/dL   Albumin 3.9 3.5 - 5.0 g/dL   AST 20 15 - 41 U/L   ALT 23 17 -  63 U/L   Alkaline Phosphatase 77 38 - 126 U/L   Total Bilirubin 1.4 (H) 0.3 - 1.2 mg/dL   GFR calc non Af Amer >60 >60 mL/min   GFR calc Af Amer >60 >60 mL/min    Comment: (NOTE) The eGFR has been calculated using the CKD EPI equation. This calculation has not been validated in all clinical situations. eGFR's persistently <60 mL/min signify possible Chronic Kidney Disease.    Anion gap 8 5 - 15  I-stat troponin, ED     Status: None   Collection Time: 11/15/16  5:18 PM  Result Value Ref Range   Troponin i, poc 0.00 0.00 - 0.08 ng/mL   Comment 3            Comment: Due to the release kinetics of cTnI, a negative result within the first hours of the onset of symptoms does not rule out myocardial infarction with certainty. If myocardial infarction is still suspected, repeat the test at appropriate intervals.   I-Stat Chem 8, ED     Status: None   Collection Time: 11/15/16  5:20 PM  Result Value Ref Range   Sodium 142 135 - 145 mmol/L   Potassium 3.7 3.5 - 5.1 mmol/L   Chloride 106 101 - 111 mmol/L   BUN 19 6 - 20 mg/dL   Creatinine, Ser 0.80 0.61 - 1.24 mg/dL   Glucose, Bld 87 65 - 99 mg/dL   Calcium, Ion 1.15 1.15 - 1.40 mmol/L   TCO2 25 22 - 32 mmol/L   Hemoglobin 16.0 13.0 - 17.0 g/dL   HCT 47.0 39.0 - 52.0 %  Urine rapid drug screen (hosp performed)     Status: None   Collection Time: 11/15/16  5:27 PM  Result Value Ref Range   Opiates NONE DETECTED NONE DETECTED   Cocaine NONE DETECTED NONE DETECTED   Benzodiazepines NONE DETECTED NONE DETECTED   Amphetamines NONE DETECTED NONE DETECTED   Tetrahydrocannabinol NONE DETECTED NONE DETECTED   Barbiturates NONE DETECTED NONE DETECTED    Comment:        DRUG SCREEN FOR MEDICAL PURPOSES ONLY.  IF CONFIRMATION IS NEEDED FOR ANY PURPOSE, NOTIFY LAB WITHIN 5 DAYS.         LOWEST DETECTABLE LIMITS FOR URINE DRUG SCREEN Drug Class       Cutoff (ng/mL) Amphetamine      1000 Barbiturate      200 Benzodiazepine   591 Tricyclics       638 Opiates          300 Cocaine          300 THC              50   Urinalysis, Routine w reflex microscopic     Status: Abnormal   Collection Time: 11/15/16  5:27 PM  Result Value Ref Range   Color, Urine YELLOW YELLOW   APPearance CLEAR CLEAR   Specific Gravity, Urine 1.035 (H) 1.005 - 1.030   pH 5.0 5.0 - 8.0   Glucose, UA >=500 (A) NEGATIVE mg/dL   Hgb urine dipstick NEGATIVE NEGATIVE   Bilirubin Urine NEGATIVE NEGATIVE   Ketones, ur NEGATIVE NEGATIVE mg/dL   Protein, ur NEGATIVE NEGATIVE mg/dL   Nitrite NEGATIVE NEGATIVE   Leukocytes, UA NEGATIVE NEGATIVE   RBC / HPF 0-5 0 - 5 RBC/hpf   WBC, UA 6-30 0 - 5 WBC/hpf   Bacteria, UA RARE (A) NONE SEEN   Squamous Epithelial / LPF 0-5 (A)  NONE SEEN   Mucus PRESENT   Urine Culture     Status: Abnormal   Collection Time: 11/15/16  5:27 PM  Result Value Ref Range   Specimen Description URINE, RANDOM    Special Requests NONE    Culture (A)     40,000 COLONIES/mL GROUP B STREP(S.AGALACTIAE)ISOLATED TESTING AGAINST S. AGALACTIAE NOT ROUTINELY PERFORMED DUE TO PREDICTABILITY OF AMP/PEN/VAN SUSCEPTIBILITY.    Report Status 11/17/2016 FINAL   Glucose, capillary     Status: Abnormal   Collection Time: 11/15/16  8:55 PM  Result Value Ref Range   Glucose-Capillary 103 (H) 65 - 99 mg/dL   Comment 1 Notify RN    Comment 2 Document in Chart   CBC     Status: None   Collection Time: 11/15/16  9:10 PM  Result Value Ref Range   WBC 7.5 4.0 - 10.5 K/uL   RBC 5.07 4.22 - 5.81 MIL/uL   Hemoglobin 15.2 13.0 - 17.0 g/dL   HCT 45.4 39.0 - 52.0 %   MCV 89.5 78.0 - 100.0 fL   MCH 30.0 26.0 - 34.0 pg   MCHC 33.5 30.0 - 36.0 g/dL   RDW 14.4 11.5 - 15.5 %   Platelets 198 150 - 400 K/uL  Vitamin B12     Status: None   Collection Time: 11/16/16  4:22 AM  Result Value Ref Range    Vitamin B-12 472 180 - 914 pg/mL    Comment: (NOTE) This assay is not validated for testing neonatal or myeloproliferative syndrome specimens for Vitamin B12 levels.   Sedimentation rate     Status: None   Collection Time: 11/16/16  4:22 AM  Result Value Ref Range   Sed Rate 2 0 - 16 mm/hr  TSH     Status: None   Collection Time: 11/16/16  4:22 AM  Result Value Ref Range   TSH 2.734 0.350 - 4.500 uIU/mL    Comment: Performed by a 3rd Generation assay with a functional sensitivity of <=0.01 uIU/mL.  Antinuclear Antibodies, IFA     Status: None   Collection Time: 11/16/16  4:22 AM  Result Value Ref Range   ANA Ab, IFA Negative     Comment: (NOTE)                                     Negative   <1:80                                     Borderline  1:80                                     Positive   >1:80 Performed At: [facility] [place], [place] [id number] Lindon Romp MD RX:[id number]   Hemoglobin A1c     Status: Abnormal   Collection Time: 11/16/16  4:22 AM  Result Value Ref Range   Hgb A1c MFr Bld 10.4 (H) 4.8 - 5.6 %    Comment: (NOTE) Pre diabetes:          5.7%-6.4% Diabetes:              >6.4% Glycemic control for   <7.0% adults with diabetes    Mean Plasma Glucose 251.78 mg/dL  Lipid panel     Status: Abnormal   Collection Time: 11/16/16  4:22 AM  Result Value Ref Range   Cholesterol 145 0 - 200 mg/dL   Triglycerides 193 (H) <150 mg/dL   HDL 39 (L) >40 mg/dL   Total CHOL/HDL Ratio 3.7 RATIO   VLDL 39 0 - 40 mg/dL   LDL Cholesterol 67 0 - 99 mg/dL    Comment:        Total Cholesterol/HDL:CHD Risk Coronary Heart Disease Risk Table                     Men   Women  1/2 Average Risk   3.4   3.3  Average Risk       5.0   4.4  2 X Average Risk   9.6   7.1  3 X Average Risk  23.4   11.0        Use the calculated Patient Ratio above and the CHD Risk Table to determine the patient's CHD Risk.        ATP III CLASSIFICATION (LDL):  <100      mg/dL   Optimal  100-129  mg/dL   Near or Above                    Optimal  130-159  mg/dL   Borderline  160-189  mg/dL   High  >190     mg/dL   Very High   Glucose, capillary     Status: Abnormal   Collection Time: 11/16/16  6:05 AM  Result Value Ref Range   Glucose-Capillary 175 (H) 65 - 99 mg/dL   Comment 1 Notify RN    Comment 2 Document in Chart   VAS US CAROTID (at [facility] and [facility] only)     Status: None   Collection Time: 11/16/16  9:14 AM  Result Value Ref Range   Right CCA prox sys -96 cm/s   Right CCA prox dias -19 cm/s   Right cca dist sys -51 cm/s   Left CCA prox sys 92 cm/s   Left CCA prox dias 19 cm/s   Left CCA dist sys -82 cm/s   Left CCA dist dias -23 cm/s   Left ICA prox sys -67 cm/s   Left ICA prox dias -22 cm/s   Left ICA dist sys -69 cm/s   Left ICA dist dias -28 cm/s   RIGHT ECA DIAS -21.00 cm/s   RIGHT VERTEBRAL DIAS -9.00 cm/s   LEFT ECA DIAS -23.00 cm/s   LEFT VERTEBRAL DIAS -11.00 cm/s  ECHOCARDIOGRAM COMPLETE     Status: Abnormal   Collection Time: 11/16/16 10:08 AM  Result Value Ref Range   Weight 4,206.4 oz   Height 73 in   BP 155/98 mmHg   LV PW d 7.22 (A) 0.6 - 1.1 mm   FS 40 28 - 44 %   Ao-asc 35 cm   LA ID, A-P, ES 37 mm   IVS/LV PW RATIO, ED 1.94    PV Reg vel dias 149 cm/s   PV Reg grad dias 9 mmHg   LV e' LATERAL 9.03 cm/s   LV E/e' medial 8.06    LV E/e'average 8.06    LA diam index 1.47 cm/m2   LA vol A4C 42.2 ml   E decel time 229 msec   LVOT diameter 22 mm   LVOT area 3.80 cm2   Peak grad 2 mmHg   E/e' ratio  8.06    MV pk E vel 72.8 m/s   MV pk A vel 92.2 m/s   MV Dec 229    LA diam end sys 37.00 mm   TDI e' medial 5.87    TDI e' lateral 9.03    Lateral S' vel 9.14 cm/sec   TAPSE 16.50 mm  Glucose, capillary     Status: Abnormal   Collection Time: 11/16/16 11:09 AM  Result Value Ref Range   Glucose-Capillary 267 (H) 65 - 99 mg/dL  Acetylcholine Receptor Ab, All     Status: None   Collection Time: 11/16/16  3:31 PM   Result Value Ref Range   Acety choline binding ab <0.03 0.00 - 0.24 nmol/L    Comment: (NOTE)                               Negative:   0.00 - 0.24                               Borderline: 0.25 - 0.40                               Positive:        > 0.40    Acetylchol Block Ab 16 0 - 25 %    Comment: (NOTE)                               Negative:      0 - 25                               Borderline:   26 - 30                               Positive:         >30 Results for this test are for research purposes only by the assay's manufacturer.  The performance characteristics of this product have not been established.  Results should not be used as a diagnostic procedure without confirmation of the diagnosis by another medically established diagnostic product or procedure.    Acetylcholine Modulat Ab <12 0 - 20 %    Comment: (NOTE)                              Negative:          <21                              Equivocal:     21 - 25                              Positive:          >25 The assay is linear between values of 12 and 64. Those <12 and >64 are reported as such.  No single value for ACR-modulating antibody should be used as a sole basis for diagnosis or response to therapy. Performed At: [facility] [address] [place], [place] [id number] Evette Doffing  Darrick Penna MD GY:[id number]   Glucose, capillary     Status: None   Collection Time: 11/16/16  4:26 PM  Result Value Ref Range   Glucose-Capillary 74 65 - 99 mg/dL  Miscellaneous [facility] test (send-out)     Status: None   Collection Time: 11/16/16  8:12 PM  Result Value Ref Range   [facility] test code [id number]    [facility] test name MUSCLE SPECIFIC KINASE ANTIBODIES    Source ([facility]) SERUM    Misc [facility] result COMMENT     Comment: (NOTE) Test Ordered: [id number] MuSK Antibodies MuSK Antibodies                <1.0             U/mL     ES   Reference Range:  Negative: <1.0  Positive: 1.0 or higher  A positive  result, in the context of congruent clinical  findings, 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 COMMENTS:  - Myasthenia gravis (MG) is caused by auto-antibodies    against proteins of the neuromuscular junction. Most    cases (about 90%) of generalized MG are anti-    acetylcholine receptor (AChR) antibody-positive.(1)  - Of generalized MG patients who lack anti-AChR antibodies    (AChR-seronegative), about 40% are positive for Muscle-    Specific Kinase (MuSK) antibody.(1,2)  - Though a positive MuSK result is specific for the    diagnosis of MuSK MG, a negative MuSK result does not    rule out a MG diagnosis.  - MuSK antibody levels have been shown to correlate with    disease severity.(3) Serial measurements may be useful    to follow treatment. References: 1. Berr ih-Aknin S et al. J Autoimmunity 2014;52:90-100. 2. Huijbers MG et al. Olam Idler 2013;110(51);20783-20788. 3. Bartoccioni E et al. Neurology 2006;67:505-507. This test was developed and its performance characteristics determined by [facility]. It has not been cleared or approved by the Food and Drug Administration. Performed At: [facility] [place], [place] [id number] Lindon Romp MD OI:[id number] Performed At: [facility] [place], [place] [id number] Pepkowitz Sheral Apley MD BS:[id number]   Glucose, capillary     Status: Abnormal   Collection Time: 11/16/16  9:42 PM  Result Value Ref Range   Glucose-Capillary 135 (H) 65 - 99 mg/dL   Comment 1 Notify RN    Comment 2 Document in Chart   Glucose, capillary     Status: Abnormal   Collection Time: 11/17/16  6:09 AM  Result Value Ref Range   Glucose-Capillary 106 (H) 65 - 99 mg/dL   Comment 1 Notify RN    Comment 2 Document in Chart   CBC     Status: None   Collection Time: 11/17/16  6:42 AM  Result Value Ref Range   WBC 5.7 4.0 - 10.5 K/uL   RBC 4.93 4.22 - 5.81 MIL/uL   Hemoglobin 14.3 13.0 -  17.0 g/dL   HCT 43.9 39.0 - 52.0 %   MCV 89.0 78.0 - 100.0 fL   MCH 29.0 26.0 - 34.0 pg   MCHC 32.6 30.0 - 36.0 g/dL   RDW 14.3 11.5 - 15.5 %   Platelets 183 150 - 400 K/uL  Basic metabolic panel     Status: Abnormal   Collection Time: 11/17/16  6:42 AM  Result Value Ref Range   Sodium 141 135 - 145 mmol/L   Potassium 3.7 3.5 - 5.1  mmol/L   Chloride 105 101 - 111 mmol/L   CO2 29 22 - 32 mmol/L   Glucose, Bld 96 65 - 99 mg/dL   BUN 21 (H) 6 - 20 mg/dL   Creatinine, Ser 1.03 0.61 - 1.24 mg/dL   Calcium 8.9 8.9 - 10.3 mg/dL   GFR calc non Af Amer >60 >60 mL/min   GFR calc Af Amer >60 >60 mL/min    Comment: (NOTE) The eGFR has been calculated using the CKD EPI equation. This calculation has not been validated in all clinical situations. eGFR's persistently <60 mL/min signify possible Chronic Kidney Disease.    Anion gap 7 5 - 15  Glucose, capillary     Status: Abnormal   Collection Time: 11/17/16  9:02 AM  Result Value Ref Range   Glucose-Capillary 206 (H) 65 - 99 mg/dL  Glucose, capillary     Status: Abnormal   Collection Time: 11/17/16 11:28 AM  Result Value Ref Range   Glucose-Capillary 153 (H) 65 - 99 mg/dL  POCT glucose (manual entry)     Status: Abnormal   Collection Time: 12/13/16  3:46 PM  Result Value Ref Range   POC Glucose 197 (A) 70 - 99 mg/dl    Objective: General: Patient is awake, alert, and oriented x 3 and in no acute distress.  Integument: Skin is warm, dry and supple bilateral. Nails are tender, long, thickened and  dystrophic with subungual debris, consistent with onychomycosis, 1-5 on right and 2-5 on left. Patient has a history of previous nail removal on left great toe. No signs of infection. No open lesions. There is a interdigital corn/callus at the medial aspect of the left fifth toe and medial right medial 1st toe with no surrounding signs of infection. Remaining integument unremarkable.  Vasculature:  Dorsalis Pedis pulse 1/4 bilateral. Posterior  Tibial pulse 1 /4 bilateral.  Capillary fill time <3 sec 1-5 bilateral. Scant hair growth to the level of the digits. Temperature gradient within normal limits. Mild varicosities present bilateral. No edema present bilateral.   Neurology: The patient has absent sensation measured with a 5.07/10g Semmes Weinstein Monofilament at toes bilateral . Vibratory sensation diminished bilateral with tuning fork. No Babinski sign present bilateral.   Musculoskeletal: Asymptomatic hammertoe pedal deformities noted bilateral. Muscular strength 5/5 in all lower extremity muscular groups bilateral without pain on range of motion. No tenderness with calf compression bilateral.  Assessment and Plan: Problem List Items Addressed This Visit    None    Visit Diagnoses    Diabetic polyneuropathy associated with type 2 diabetes mellitus ([place])    -  Primary   Pain due to onychomycosis of toenails of both feet       Foot callus       Hammer toes of both feet       Toe pain, left         -Examined patient. -Discussed and educated patient on diabetic foot care, especially with  regards to the vascular, neurological and musculoskeletal systems.  -Stressed the importance of good glycemic control and the detriment of not  controlling glucose levels in relation to the foot. -Mechanically debridedCallus at left fifth toe and medial right 1st toe using a sterile tissue nipper without incident and debrided mechanically all nails 1-5 on right, 2-5 on left using sterile nail nipper and filed with dremel without incident  -Answered all patient questions -Patient to return  in 3 months for at risk foot care -Patient advised to call the  office if any problems or questions arise in the meantime.  Landis Martins, DPM

## 2017-01-03 ENCOUNTER — Telehealth: Payer: Self-pay | Admitting: Neurology

## 2017-01-03 ENCOUNTER — Telehealth: Payer: Self-pay | Admitting: *Deleted

## 2017-01-03 ENCOUNTER — Other Ambulatory Visit: Payer: Self-pay | Admitting: Neurology

## 2017-01-03 DIAGNOSIS — G4731 Primary central sleep apnea: Secondary | ICD-10-CM | POA: Insufficient documentation

## 2017-01-03 DIAGNOSIS — R063 Periodic breathing: Secondary | ICD-10-CM

## 2017-01-03 DIAGNOSIS — G4733 Obstructive sleep apnea (adult) (pediatric): Secondary | ICD-10-CM

## 2017-01-03 NOTE — Telephone Encounter (Signed)
Called to go over sleep study results with the patient's wife. Pt had also called earlier today. LVM for the patients wife to call back to discuss.

## 2017-01-03 NOTE — Telephone Encounter (Signed)
Patient had an appt. scheduled for 01/05/2017 ato 8:30 that was canceled by patient.  States 8:30 is just too early for him.  He is rescheuduled to the next available which is in March.  She wonders if there is any way he could be seen sooner?  Also states he was seen in the hospital for stroke symptoms.  Wants to know if Dr. Brett Fairy will be the MD to f/u on that?  Please call.

## 2017-01-03 NOTE — Procedures (Signed)
PATIENT'S NAME:  Raymond Mays, Raymond Mays DOB:      10-Feb-1945      MR#:    [id number]     DATE OF RECORDING: 12/27/2016 REFERRING M.D.:  Derinda Late, M.D. Study Performed:   ASV Titration HISTORY:  high AHI on baseline study, CPAP titration on 10-11-2016 failed, Ahi was 38.2. The patient returned for a BiPAP titration, and again failed to find relief. He now returned on 12-27-2016 - ST settings did not improve the AHI, and finally he was placed on ASV titration.  The patient endorsed the Epworth Sleepiness Scale at 16/24 points.   The patient's weight 227 pounds with a height of 73 (inches), resulting in a BMI of 30.1 kg/m2. The patient's neck circumference measured 18.5 inches.  CURRENT MEDICATIONS: Zyloprim, Abilify, ASA 81mg , Wellbutrin, Invokamet XR, Desowen cream, Exenatide, Veramyst, Levemir, Humalog, CoZaar, Prilosec, Crestor, Effexor  PROCEDURE:  This is a multichannel digital polysomnogram utilizing the SomnoStar 11.2 system.  Electrodes and sensors were applied and monitored per AASM Specifications.   EEG, EOG, Chin and Limb EMG, were sampled at 200 Hz.  ECG, Snore and Nasal Pressure, Thermal Airflow, Respiratory Effort, CPAP Flow and Pressure, Oximetry was sampled at 50 Hz. Digital video and audio were recorded.       Patient failed by BiPAP, BiPAP ST as documented in the titration protocol. Technologist finally initiated ASV with FFM, 15 maximum pressure support, 3 cm minimum pressure support and EPAP of 8 cmH20 with heated humidity per AASM standards. Only in non- supine sleep position did this therapy finally control Cheyne Stokes respiration, cyclic breathing and all obstruction as well.  The final pressure in ASV was min pressure support of 6 cm H20, max PS of 15 cm H20 and EPAP  at 10 cm water pressure, there was a reduction of the AHI to 0.0 with improvement of the above symptoms of complex sleep apnea and cyclic breathing . This setting allowed for 94% sleep efficiency and 33 minutes of  uninterrupted NREM sleep.    Lights Out was at 21:12 and Lights On at 05:18. Total recording time (TRT) was 486.5 minutes, with a total sleep time (TST) of 208 minutes. The patient's sleep latency was 137.5 minutes with 0.5 minutes of wake time after sleep onset. REM latency was 185.5 minutes.  The sleep efficiency was 42.8 %.    SLEEP ARCHITECTURE: WASO (Wake after sleep onset) was 150 minutes.  There were 80 minutes in Stage N1, 64 minutes Stage N2, 29.5 minutes Stage N3 and 34.5 minutes in Stage REM.  The percentage of Stage N1 was 38.5%, Stage N2 was 30.8%, Stage N3 was 14.2% and Stage R (REM sleep) was 16.6%.   RESPIRATORY ANALYSIS:  There was a total of 92 respiratory events: 0 obstructive apneas, 83 central apneas and 9 mixed apneas with a total of 92 apneas and an apnea index (AI) of 26.5 /hour. There were 0 hypopneas with a hypopnea index of 0/hour. The patient also had 0 respiratory event related arousals (RERAs).     The total APNEA/HYPOPNEA INDEX  (AHI) was 26.5 /hour and the total RESPIRATORY DISTURBANCE INDEX was 26.5/ hr. 21 events occurred in REM sleep and 71 events in NREM. The REM AHI was 36.5 /hour versus a non-REM AHI of 24.6 /hour.  The patient spent 127.5 minutes of total sleep time in the supine position and 81 minutes in non-supine. The supine AHI was 39.5, versus a non-supine AHI of 6.0.  OXYGEN SATURATION & C02:  The baseline  02 saturation was 94%, with the lowest being 78%. Time spent below 89% saturation equaled 140 minutes. At the final setting ASV, there was a nadir of 86% SpO2 noted.   PERIODIC LIMB MOVEMENTS:    The patient had a total of 0 Periodic Limb Movements. The Periodic Limb Movement (PLM) index was 0 and the PLM Arousal index was 0 /hour. The arousals were noted as: 68 were spontaneous, 0 were associated with PLMs, and a total of 47 were associated with respiratory events. Audio and video analysis did not show any abnormal or unusual movements, behaviors,  phonations or vocalizations.    Post-study, the patient indicated that sleep was the same as usual.   DIAGNOSIS 1. 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   Hypoxemia.  The final pressure in ASV was min pressure support of 6 cm H20, max PS of 15 cm H20 and EPAP at 10 cm water pressure, there was a reduction of the AHI to 0.0 with improvement of the above symptoms of complex sleep apnea and cyclic breathing . This setting allowed for 94% sleep efficiency and 33 minutes of uninterrupted NREM sleep.  The patient can only achieve this resolution while sleeping on his side, not in supine.  The patient was fitted with a [employer] FFM in large size.   PLANS/RECOMMENDATIONS: Follow up  with MD after 60-90 days on ASV therapy. Settings as above, FFM as above. The patient needs to avoid supine sleep (Tennis ball method or use of a contoured pillow). ONO one night while on ASV to determine if additional oxygen is needed.    A follow up appointment will be scheduled in the Sleep Clinic at [facility].   Please call [phone] with any questions.     I certify that I have reviewed the entire raw data recording prior to the issuance of this report in accordance with the Standards of Accreditation of the American Academy of Sleep Medicine (AASM)    Larey Seat, M.D.     01-03-2017  Diplomat, American Board of Psychiatry and Neurology  Diplomat, [place] of Sleep Medicine Medical Director, [place] [facility]

## 2017-01-03 NOTE — Telephone Encounter (Signed)
-----   Message from Larey Seat, MD sent at 01/03/2017  2:13 PM EST ----- DIAGNOSIS 1. 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   Hypoxemia.  3. Some obstructive events ( OSA) . Patient had been failed by CPAP, BiPAP and BiPAP ST.   The patient responded to ASV - settings of min. PS of 6 cm water, max. PS of 15 cm water and EEP/ EPAP of 10 cm water.  FFM large , [employer]Med   air touch model.  The patient needs to sleep on his side- his respirations cannot be controlled in supine.  CD  Cc Dr Sandi Mariscal

## 2017-01-04 NOTE — Telephone Encounter (Signed)
Called patient's wife to discuss sleep study results. No answer at this time. LVM for the patient to call back.

## 2017-01-05 ENCOUNTER — Ambulatory Visit: Payer: Medicare Other | Admitting: Neurology

## 2017-01-06 NOTE — Progress Notes (Signed)
Cardiology Office Note   Date:  01/07/2017   ID:  Raymond Mays, DOB [birth year], MRN [id number]  PCP:  Derinda Late, MD  Cardiologist:   Minus Breeding, MD  Referring:  Derinda Late, MD    Chief Complaint  Patient presents with  . Coronary Artery Disease      History of Present Illness: Raymond Mays is a 71 y.o. male who is referred by Derinda Late, MD for evaluation of CAD.  He has a history of stent placement to the circumflex in 2007.     He was treated in [facility] Last catheterization was 2015. At that time he had 30% proximal diffuse LAD stenosis, patent stent in circumflex, 50% obtuse marginal stenosis.  In Sept he had a negative Lexiscan Myoview.  He had diplopia in October and was in the hospital.  I reviewed these records including reports of his imaging.  He had an echo which demonstrated no significant abnormalities.    He says he has had no recurrent symptoms since then.  He was supposed to follow-up with neurology but he has not done this.  He is taking aspirin and Plavix for the next 3 months and then will probably be on Plavix alone.  He is not active with exercising although he does his chores of daily living.  The patient denies any new symptoms such as chest discomfort, neck or arm discomfort. There has been no new shortness of breath, PND or orthopnea. There have been no reported palpitations, presyncope or syncope.     Past Medical History:  Diagnosis Date  . Alcohol abuse   . CAD (coronary artery disease)    Stent to circ 2007, patent on cath 2015.  NL EF  . [facility]    prostate  . Depression   . Diabetes mellitus without complication ([place])   . Hyperlipemia   . Hypertension   . Neuropathy   . Sleep apnea     Past Surgical History:  Procedure Laterality Date  . CHOLECYSTECTOMY    . COLONOSCOPY    . heart stent    . KNEE SURGERY     bakers cyst  . PROSTATECTOMY    . TONSILLECTOMY       Current Outpatient  Medications  Medication Sig Dispense Refill  . albuterol (PROAIR HFA) 108 (90 Base) MCG/ACT inhaler Inhale 1-2 puffs into the lungs every 6 (six) hours as needed for wheezing.    Marland Kitchen allopurinol (ZYLOPRIM) 300 MG tablet Take 300 mg by mouth daily.    Marland Kitchen aspirin EC 325 MG EC tablet Take 1 tablet (325 mg total) by mouth daily. Please continue Aspirin and plavix for 3 months, then plavix alone 30 tablet 3  . buPROPion (WELLBUTRIN SR) 150 MG 12 hr tablet Take 150 mg by mouth daily.     . Canagliflozin-Metformin HCl ER (INVOKAMET XR) 150-500 MG TB24 Take 2 tablets daily by mouth. 60 tablet 3  . clopidogrel (PLAVIX) 75 MG tablet Take 1 tablet (75 mg total) by mouth daily. 30 tablet 5  . Evolocumab (REPATHA) 140 MG/ML SOSY Inject 140 mg into the skin every 14 (fourteen) days.    . Exenatide ER 2 MG PEN Inject into the skin.    . Insulin Degludec (TRESIBA FLEXTOUCH) 200 UNIT/ML SOPN Tresiba FlexTouch U-200 insulin 200 unit/mL (3 mL) subcutaneous pen  Inject 50 units every day by subcutaneous route.    . insulin lispro (HUMALOG) 100 UNIT/ML injection Inject 18 units at breakfast, inject 18  units at lunch, and inject 20 units at dinner. 20 mL 3  . losartan (COZAAR) 50 MG tablet Take 50 mg by mouth daily.    . NONFORMULARY OR COMPOUNDED ITEM Apply 1 application topically 3 (three) times daily as needed (MUSCLE SORENESS). 5,000-LIDOCAINE 5% BASE GEL IN A PUMP    . omeprazole (PRILOSEC) 40 MG capsule Take 20 mg by mouth 2 (two) times daily.     . rosuvastatin (CRESTOR) 10 MG tablet Take 10 mg by mouth daily.    . Semaglutide (OZEMPIC) 0.25 or 0.5 MG/DOSE SOPN Inject 0.5 mg once a week into the skin. 1 pen 2  . venlafaxine (EFFEXOR) 75 MG tablet Take 75 mg by mouth 2 (two) times daily with a meal.     No current facility-administered medications for this visit.     Allergies:   Percocet [oxycodone-acetaminophen]     ROS:  Please see the history of present illness.   Otherwise, review of systems are positive  for none.   All other systems are reviewed and negative.    PHYSICAL EXAM: VS:  BP (!) 144/90   Pulse (!) 112   Ht 6\' 1"  (1.854 m)   Wt 260 lb 9.6 oz (118.2 kg)   BMI 34.38 kg/m  , BMI Body mass index is 34.38 kg/m.  GENERAL:  Well appearing NECK:  No jugular venous distention, waveform within normal limits, carotid upstroke brisk and symmetric, no bruits, no thyromegaly LUNGS:  Clear to auscultation bilaterally CHEST:  Unremarkable HEART:  PMI not displaced or sustained,S1 and S2 within normal limits, no S3, no S4, no clicks, no rubs, no murmurs ABD:  Flat, positive bowel sounds normal in frequency in pitch, no bruits, no rebound, no guarding, no midline pulsatile mass, no hepatomegaly, no splenomegaly EXT:  2 plus pulses throughout, no edema, no cyanosis no clubbing    EKG:  EKG is not ordered today.   Recent Labs: 10/07/2016: Brain Natriuretic Peptide 7.0 11/15/2016: ALT 23 11/16/2016: TSH 2.734 11/17/2016: BUN 21; Creatinine, Ser 1.03; Hemoglobin 14.3; Platelets 183; Potassium 3.7; Sodium 141    Lipid Panel    Component Value Date/Time   CHOL 145 11/16/2016 0422   TRIG 193 (H) 11/16/2016 0422   HDL 39 (L) 11/16/2016 0422   CHOLHDL 3.7 11/16/2016 0422   VLDL 39 11/16/2016 0422   LDLCALC 67 11/16/2016 0422   LDLDIRECT 133.0 11/10/2016 1449      Wt Readings from Last 3 Encounters:  01/07/17 260 lb 9.6 oz (118.2 kg)  12/13/16 264 lb 12.8 oz (120.1 kg)  12/13/16 263 lb (119.3 kg)    Lab Results  Component Value Date   HGBA1C 10.4 (H) 11/16/2016    Other studies Reviewed: Additional studies/ records that were reviewed today include:  Hospital records Review of the above records demonstrates:     ASSESSMENT AND PLAN:  CAD:   He had a negative perfusion study.  No further testing is indicated at this point.  SLEEP APNEA:   He has not tolerated CPAP but they are looking at getting him a new machine.  He does have follow-up in the spring in the neurology office  for this. .  DM:  A1c was 10.4 in October.  He says he is not good with his diet and we talked about this.  He has seen a dietitian.  He has an appointment with Dr. Dwyane Dee in Dec and I highlighted this on his AVS and the importance of better control.  He also  follows with .opcp  DYSLIPIDEMIA:    LDL was 67.  He will continue on meds as listed.   TIA:  He will remain on the meds as listed.  He was supposed to follow up with Dr. Erlinda Hong and I will arrange this.    Current medicines are reviewed at length with the patient today.  The patient does not have concerns regarding medicines.  The following changes have been made:  None  Labs/ tests ordered today include:   None  No orders of the defined types were placed in this encounter.    Disposition:   FU with 12  months.      Signed, Minus Breeding, MD  01/07/2017 11:52 AM    [facility]

## 2017-01-07 ENCOUNTER — Ambulatory Visit: Payer: Medicare Other | Admitting: Cardiology

## 2017-01-07 ENCOUNTER — Encounter: Payer: Self-pay | Admitting: Cardiology

## 2017-01-07 VITALS — BP 144/90 | HR 112 | Ht 73.0 in | Wt 260.6 lb

## 2017-01-07 DIAGNOSIS — Z794 Long term (current) use of insulin: Secondary | ICD-10-CM

## 2017-01-07 DIAGNOSIS — E785 Hyperlipidemia, unspecified: Secondary | ICD-10-CM | POA: Diagnosis not present

## 2017-01-07 DIAGNOSIS — G459 Transient cerebral ischemic attack, unspecified: Secondary | ICD-10-CM | POA: Diagnosis not present

## 2017-01-07 DIAGNOSIS — E118 Type 2 diabetes mellitus with unspecified complications: Secondary | ICD-10-CM

## 2017-01-07 DIAGNOSIS — I2510 Atherosclerotic heart disease of native coronary artery without angina pectoris: Secondary | ICD-10-CM | POA: Diagnosis not present

## 2017-01-07 NOTE — Telephone Encounter (Signed)
Raymond Mays from Cover my meds need a PA for medication Semaglutide (OZEMPIC) 0.25 or 0.5 MG/DOSE SOPN [[id number]]    Ref key [id number]

## 2017-01-07 NOTE — Patient Instructions (Signed)
Medication Instructions:  Continue current medications  If you need a refill on your cardiac medications before your next appointment, please call your pharmacy.  Labwork: None Ordered   Testing/Procedures: None Ordered  Follow-Up: Your physician wants you to follow-up in: 1 Year. You should receive a reminder letter in the mail two months in advance. If you do not receive a letter, please call our office [phone].    Thank you for choosing [facility] at [facility]!!      

## 2017-01-10 NOTE — Telephone Encounter (Signed)
Can you start this PA if you have not started already? Thank you!

## 2017-01-11 ENCOUNTER — Telehealth: Payer: Self-pay | Admitting: *Deleted

## 2017-01-11 ENCOUNTER — Telehealth: Payer: Self-pay

## 2017-01-11 NOTE — Telephone Encounter (Signed)
PA denied for ozempic patient is advised to stay on Bydureon

## 2017-01-11 NOTE — Telephone Encounter (Signed)
Patient advised to stay on bydureon per Dr. Dwyane Dee

## 2017-01-11 NOTE — Telephone Encounter (Signed)
Raymond Mays calling from [employer] was inquiring about the prior auth for Raymond Mays. She is wondering if the prior Raymond Mays is in process and if assistance is needed. If so please call [phone] Reference # [id number]. Please advise. Thank you .

## 2017-01-17 ENCOUNTER — Ambulatory Visit: Payer: Medicare Other | Admitting: Endocrinology

## 2017-01-20 NOTE — Telephone Encounter (Signed)
I called pt. I advised pt that Dr. Brett Fairy reviewed their sleep study results and found that pt has sleep apnea. Dr. Brett Fairy recommends that pt start ASV. I reviewed PAP compliance expectations with the pt. Pt is agreeable to starting an ASV. I advised pt that an order will be sent to a DME, [facility], and [facility] will call the pt within about one week after they file with the pt's insurance. [facility] will show the pt how to use the machine, fit for masks, and troubleshoot the ASV if needed. A follow up appt was made for insurance purposes with Dr. Brett Fairy on April 19, 2017 at 2:30 pm . Pt verbalized understanding to arrive 15 minutes early and bring their ASV. A letter with all of this information in it will be mailed to the pt as a reminder. I verified with the pt that the address we have on file is correct. Pt verbalized understanding of results. Pt had no questions at this time but was encouraged to call back if questions arise.

## 2017-01-26 NOTE — Telephone Encounter (Signed)
Sherry/Dr Sandi Mariscal [phone] (ask for Matt Holmes) called to advise the pt has not been contacted by [facility] for AVS. She is is wanting to discuss. Please call, she will be available today and tomorrow.

## 2017-01-26 NOTE — Telephone Encounter (Signed)
I have spoke with Raymond Mays and gave her an update on where we were. She would like [place] to send the orders to [facility] and see if they will have better luck with getting the patient started. I will send those orders today and notify [facility] to tell them to cancel with them

## 2017-01-26 NOTE — Addendum Note (Signed)
Addended by: Darleen Crocker on: 01/26/2017 12:36 PM   Modules accepted: Orders

## 2017-02-09 ENCOUNTER — Telehealth: Payer: Self-pay | Admitting: Neurology

## 2017-02-09 NOTE — Telephone Encounter (Signed)
Pt wife(on DPR) has called and stated they received a letter stating [facility] would provide an ASV, pt wife contacted [facility] and was told they did not receive orders for the ASV, pt wife is asking for a call for clarification

## 2017-02-09 NOTE — Telephone Encounter (Signed)
Called the patient's wife and made her aware that her PCP had reached out to me and asked about me getting her set up with someone else. I informed her that I sent the orders to [facility] and they have the current orders. I reached out to [facility] to inquire the status and I also gave the patient their phone number for her to reach out as well.

## 2017-02-16 ENCOUNTER — Encounter (HOSPITAL_COMMUNITY): Payer: Self-pay | Admitting: Psychiatry

## 2017-02-16 ENCOUNTER — Ambulatory Visit (INDEPENDENT_AMBULATORY_CARE_PROVIDER_SITE_OTHER): Payer: Medicare Other | Admitting: Psychiatry

## 2017-02-16 VITALS — BP 132/74 | HR 100 | Ht 73.0 in | Wt 255.0 lb

## 2017-02-16 DIAGNOSIS — E119 Type 2 diabetes mellitus without complications: Secondary | ICD-10-CM | POA: Diagnosis not present

## 2017-02-16 DIAGNOSIS — G629 Polyneuropathy, unspecified: Secondary | ICD-10-CM

## 2017-02-16 DIAGNOSIS — I2510 Atherosclerotic heart disease of native coronary artery without angina pectoris: Secondary | ICD-10-CM | POA: Diagnosis not present

## 2017-02-16 DIAGNOSIS — R413 Other amnesia: Secondary | ICD-10-CM | POA: Diagnosis not present

## 2017-02-16 DIAGNOSIS — K219 Gastro-esophageal reflux disease without esophagitis: Secondary | ICD-10-CM | POA: Diagnosis not present

## 2017-02-16 DIAGNOSIS — R251 Tremor, unspecified: Secondary | ICD-10-CM | POA: Diagnosis not present

## 2017-02-16 DIAGNOSIS — F331 Major depressive disorder, recurrent, moderate: Secondary | ICD-10-CM | POA: Diagnosis not present

## 2017-02-16 DIAGNOSIS — I10 Essential (primary) hypertension: Secondary | ICD-10-CM

## 2017-02-16 DIAGNOSIS — F419 Anxiety disorder, unspecified: Secondary | ICD-10-CM

## 2017-02-16 DIAGNOSIS — F4010 Social phobia, unspecified: Secondary | ICD-10-CM | POA: Diagnosis not present

## 2017-02-16 MED ORDER — DULOXETINE HCL 20 MG PO CPEP: ORAL_CAPSULE | ORAL | 1 refills | Status: DC

## 2017-02-16 NOTE — Progress Notes (Signed)
Psychiatric Initial Adult Assessment   Patient Identification: Raymond Mays MRN:  [id number] Date of Evaluation:  02/16/2017 Referral Source: Dr Sandi Mariscal Chief Complaint:  I have depression.  I have no motivation to do things. Visit Diagnosis:    ICD-10-CM   1. MDD (major depressive disorder), recurrent episode, moderate (HCC) F33.1 DULoxetine (CYMBALTA) 20 MG capsule    History of Present Illness: Raymond Mays is a 72 year old Caucasian retired married man who is referred from his primary care physician came for his initial appointment with his wife.  Patient has history of depression and he has seen Dr. Wallace Going at [facility] in the past.  As per his wife he stopped drinking more than 3 years ago all of a sudden and started to have severe irritability, agitation, hallucination, mood swings, depression and his primary care physician recommended to see psychiatrist Dr. Wallace Going.  He was given Abilify and Effexor at that time.  Patient told these medicine work very well however when he resumed care with his primary care physician he stopped Abilify and added Wellbutrin.  Patient and his wife endorsed that in the past few years his agitation mood swing has been greatly diminished but he continued to have depression and lack of motivation.  He has decreased energy, fatigue, social isolation, self-neglect and motivation to do things.  His wife endorsed that there are days that he does not take shower or change his clothes.  He used to enjoy reading books but lately he has no interest in reading books, listening music or watching TV.  He is taking Wellbutrin XL 300 mg daily but has not noticed improvement in his depression.  He denies any paranoia, self abusive behavior, crying spells but sometimes endorse feeling hopeless helpless and worthless.  In October he had a stroke like symptoms and he was diagnosed with TIA.  He has a diplopia and he saw a neurologist in the hospital.  He also have sleep apnea and he  sleeps 10-14 hours a day.  His neurologist is working to get him CPAP machine.  Patient lives with his wife who is been married for 10 years.  Patient moved 8 months ago from [place] to [place] area.  His wife has a family in [place] and he has children lives in [place].  The patient admitted sometimes hears voices when people call his name but they are not bother him and does not experience every day.  He denies any anger, mania, OCD or any PTSD symptoms.  He worried about his health but he denies any panic attack.  Patient has multiple health issues including diabetes mellitus, neuropathy in his feet, hypertension, coronary artery disease, GERD.  He admitted noncompliant with his diet plan and his last hemoglobin A1c was 10.2.  Patient also have difficulty remembering things.  Most of the information was obtained and provided by his wife.  He admitted sometimes poor attention and concentration and there is a question of underlying dementia.  The time he need assistance from his wife patient has appointment to see neurologist in March.  His wife does not let him drive because of the history of diplopia, TIA.  Patient denies any current drinking or using any illegal substances.  He has mild tremors in his hand.  He is open to try a new medication.  Though he does not believe he has memory problem  Associated Signs/Symptoms: Depression Symptoms:  depressed mood, anhedonia, hypersomnia, fatigue, difficulty concentrating, hopelessness, anxiety, (Hypo) Manic Symptoms:  No manic symptoms Anxiety Symptoms:  Excessive Worry, Social Anxiety, Psychotic Symptoms:  Hallucinations: Complaining of auditory hallucination, bili people calling his name. PTSD Symptoms: Negative  Past Psychiatric History: Patient remembers being depressed in his second marriage.  His marriage ended after 2 years.  He admitted having suicidal thoughts due to medical problems but he never had any suicidal attempt.  Measured  seeing therapist at that time.  He saw psychiatrist at [facility] 4 years ago when he stopped alcohol and reported significant irritability, mood swings, depression, anger and poor sleep.  He was given Abilify 5 mg which was later discontinued by his primary care physician.  Patient denies any psychiatric inpatient treatment, mania, self abusive behavior.  He reported history of heavy drinking most of his life including binge drinking but denies any seizures or blackouts or any rehabilitation.  Patient denies any illegal substance use.  Previous Psychotropic Medications: Yes   Substance Abuse History in the last 12 months:  No.  Consequences of Substance Abuse: Negative  Past Medical History:  Past Medical History:  Diagnosis Date  . Alcohol abuse   . CAD (coronary artery disease)    Stent to circ 2007, patent on cath 2015.  NL EF  . Cancer [facility])    prostate  . Depression   . Diabetes mellitus without complication ([facility])   . Hyperlipemia   . Hypertension   . Neuropathy   . Sleep apnea     Past Surgical History:  Procedure Laterality Date  . CHOLECYSTECTOMY    . COLONOSCOPY    . heart stent    . KNEE SURGERY     bakers cyst  . PROSTATECTOMY    . TONSILLECTOMY      Family Psychiatric History: Reviewed.  Family History:  Family History  Problem Relation Age of Onset  . Heart attack Mother        Died 80s  . Prostate cancer Father   . Diabetes Father     Social History:   Social History   Socioeconomic History  . Marital status: Married    Spouse name: Not on file  . Number of children: Not on file  . Years of education: Not on file  . Highest education level: Not on file  Social Needs  . Financial resource strain: Not on file  . Food insecurity - worry: Not on file  . Food insecurity - inability: Not on file  . Transportation needs - medical: Not on file  . Transportation needs - non-medical: Not on file  Occupational History  . Not on file  Tobacco Use   . Smoking status: Never Smoker  . Smokeless tobacco: Never Used  Substance and Sexual Activity  . Alcohol use: No  . Drug use: No  . Sexual activity: Not on file  Other Topics Concern  . Not on file  Social History Narrative   He is originally from [place]. He lived in [place]. 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    Additional Social History: Patient born and raised in [place].  He married 3 times.  His first marriage died and his second marriage ended after 2 years.  He has been married with his third wife for more than 10 years and his wife is very supportive.  He has a daughter and son from his first marriage who lives in [place].  Patient served in [employer]. however he never been in combat zone.  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  Allergies:   Allergies  Allergen Reactions  . Percocet [Oxycodone-Acetaminophen] Itching    Metabolic Disorder Labs: Lab Results  Component Value Date   HGBA1C 10.4 (H) 11/16/2016   MPG 251.78 11/16/2016   No results found for: PROLACTIN Lab Results  Component Value Date   CHOL 145 11/16/2016   TRIG 193 (H) 11/16/2016   HDL 39 (L) 11/16/2016   CHOLHDL 3.7 11/16/2016   VLDL 39 11/16/2016   LDLCALC 67 11/16/2016     Current Medications: Current Outpatient Medications  Medication Sig Dispense Refill  . albuterol (PROAIR HFA) 108 (90 Base) MCG/ACT inhaler Inhale 1-2 puffs into the lungs every 6 (six) hours as needed for wheezing.    Marland Kitchen allopurinol (ZYLOPRIM) 300 MG tablet Take 300 mg by mouth daily.    Marland Kitchen aspirin EC 325 MG EC tablet Take 1 tablet (325 mg total) by mouth daily. Please continue Aspirin and plavix for 3 months, then plavix alone 30 tablet 3  . Canagliflozin-Metformin HCl ER (INVOKAMET XR) 150-500 MG TB24 Take 2 tablets daily by mouth. 60 tablet 3  . clopidogrel (PLAVIX) 75 MG tablet Take 1 tablet (75 mg total) by mouth daily. 30 tablet 5  . DULoxetine (CYMBALTA) 20 MG  capsule Take one capsule daily for 1 week and than twice daily 60 capsule 1  . Evolocumab (REPATHA) 140 MG/ML SOSY Inject 140 mg into the skin every 14 (fourteen) days.    . Exenatide ER 2 MG PEN Inject into the skin.    . Insulin Degludec (TRESIBA FLEXTOUCH) 200 UNIT/ML SOPN Tresiba FlexTouch U-200 insulin 200 unit/mL (3 mL) subcutaneous pen  Inject 50 units every day by subcutaneous route.    . insulin lispro (HUMALOG) 100 UNIT/ML injection Inject 18 units at breakfast, inject 18 units at lunch, and inject 20 units at dinner. 20 mL 3  . losartan (COZAAR) 50 MG tablet Take 50 mg by mouth daily.    . NONFORMULARY OR COMPOUNDED ITEM Apply 1 application topically 3 (three) times daily as needed (MUSCLE SORENESS). 5,000-LIDOCAINE 5% BASE GEL IN A PUMP    . omeprazole (PRILOSEC) 40 MG capsule Take 20 mg by mouth 2 (two) times daily.     . rosuvastatin (CRESTOR) 10 MG tablet Take 10 mg by mouth daily.    . Semaglutide (OZEMPIC) 0.25 or 0.5 MG/DOSE SOPN Inject 0.5 mg once a week into the skin. 1 pen 2  . venlafaxine (EFFEXOR) 75 MG tablet Take 75 mg by mouth 2 (two) times daily with a meal.     No current facility-administered medications for this visit.     Neurologic: Headache: No Seizure: No Paresthesias:Numbness in his feet  Musculoskeletal: Strength & Muscle Tone: within normal limits Gait & Station: normal Patient leans: N/A  Psychiatric Specialty Exam: Review of Systems  Constitutional: Negative.   HENT: Negative.   Skin: Negative.   Neurological: Positive for tingling and tremors.  Psychiatric/Behavioral: Positive for depression and memory loss.    Blood pressure 132/74, pulse 100, height 6\' 1"  (1.854 m), weight 255 lb (115.7 kg).Body mass index is 33.64 kg/m.  General Appearance: Casual  Eye Contact:  Fair  Speech:  Slow  Volume:  Decreased  Mood:  Dysphoric and Hopeless  Affect:  Constricted and Depressed  Thought Process:  Linear  Orientation:  Full (Time, Place, and  Person)  Thought Content:  Hallucinations: Auditory People calling his name and Rumination  Suicidal Thoughts:  No  Homicidal Thoughts:  No  Memory:  Immediate;  Fair Recent;   Fair Remote;   Fair  Judgement:  Good  Insight:  Good  Psychomotor Activity:  Decreased and Mild tremors in his both hand  Concentration:  Concentration: Fair and Attention Span: Fair  Recall:  [employer] of Knowledge:Good  Language: Good  Akathisia:  No  Handed:  Right  AIMS (if indicated):  0  Assets:  Communication Skills Desire for Improvement Housing Social Support  ADL's:  Intact  Cognition: Impaired,  Mild  Sleep: Hypersomnia   Assessment: Major depressive disorder, recurrent moderate.  Anxiety disorder NOS.  Plan: I review his symptoms, current medication, psychosocial stressors, history and collect information from his primary care physician.  Patient is taking Wellbutrin XL 300 mg daily and Effexor 150 mg daily but continued to have depressive symptoms.  He has lack of motivation to do things.  He is self-neglect and not taking showers and changing his clothes.  Recommended to try Cymbalta 20 mg daily for 1 week and then 40 mg daily.  Continue Effexor 150 mg daily.  Discontinue Wellbutrin.  I do believe patient may get benefit for CBT and supportive therapy.  Will schedule appointment with a therapist in this office.  Discussed medication side effects and benefits.  We do not have complete blood work results including TSH.  We will contact his primary care physician for blood work results.  Patient has appointment to see neurology in March and I encouraged him that he should addressed his sleep apnea and memory issues with the neurologist.  Recommended to call us back if is any question, concern or if he feels worsening of the symptoms.  Discussed safety concerns at any time having active suicidal thoughts or homicidal thought that he need to call 911 or go to local emergency room.  Follow-up in 4  weeks.   Kathlee Nations, MD 1/9/201911:55 AM

## 2017-03-10 ENCOUNTER — Encounter: Payer: Self-pay | Admitting: Radiation Oncology

## 2017-03-15 ENCOUNTER — Telehealth: Payer: Self-pay | Admitting: Neurology

## 2017-03-15 NOTE — Telephone Encounter (Signed)
Pt's wife called and stated that pt Raymond Mays is interested in reducing the pressure of his cpap machine. Wife stated that the best method of contact is [phone].

## 2017-03-21 ENCOUNTER — Telehealth: Payer: Self-pay | Admitting: Neurology

## 2017-03-21 ENCOUNTER — Ambulatory Visit (HOSPITAL_COMMUNITY): Payer: Self-pay | Admitting: Psychiatry

## 2017-03-21 NOTE — Telephone Encounter (Signed)
I have printed the patients download of the last 12 days due to the patient just being set up on 1/30. I will wait for Dr Brett Fairy to review it and see if there should be changes made. Dr Brett Fairy is out of the office today.

## 2017-03-21 NOTE — Telephone Encounter (Signed)
Pt's wife called and stated that Raymond Mays is needing the pressure lowered on his cpap machine. The best method of contact is  [phone].

## 2017-03-22 NOTE — Telephone Encounter (Signed)
Called and spoke with the patients wife. I made her aware that Dr Brett Fairy looked over his download and she states that this machine and the current pressure that he is on is treating the apnea really well. She doesn't want to make any adjustments or changes to the machine. The patients wife verbalized understanding. She states that the mask rubs a sore and I have encouraged the pt to reach out to [facility] to see if there is any assistance they may have to offer. Pt's wife states that she has reached out and left a message with hopes of being able to swing by there tomorrow. I will also send an email to [facility] to make sure they reach back out to the patient. I encouraged her to inform the pt that he needs to try and use it at least 4 hours a night. The patient's wife verbalized understanding and was appreciative for the call back

## 2017-03-23 ENCOUNTER — Ambulatory Visit
Admission: RE | Admit: 2017-03-23 | Discharge: 2017-03-23 | Disposition: A | Discharge status: Home / Self Care | Payer: Medicare Other | Source: Ambulatory Visit | Attending: Radiation Oncology | Admitting: Radiation Oncology

## 2017-03-23 ENCOUNTER — Other Ambulatory Visit: Payer: Self-pay

## 2017-03-23 ENCOUNTER — Encounter: Payer: Self-pay | Admitting: Radiation Oncology

## 2017-03-23 DIAGNOSIS — F329 Major depressive disorder, single episode, unspecified: Secondary | ICD-10-CM | POA: Insufficient documentation

## 2017-03-23 DIAGNOSIS — Z833 Family history of diabetes mellitus: Secondary | ICD-10-CM | POA: Insufficient documentation

## 2017-03-23 DIAGNOSIS — E785 Hyperlipidemia, unspecified: Secondary | ICD-10-CM | POA: Diagnosis not present

## 2017-03-23 DIAGNOSIS — Z794 Long term (current) use of insulin: Secondary | ICD-10-CM | POA: Insufficient documentation

## 2017-03-23 DIAGNOSIS — Z79899 Other long term (current) drug therapy: Secondary | ICD-10-CM | POA: Insufficient documentation

## 2017-03-23 DIAGNOSIS — Z7982 Long term (current) use of aspirin: Secondary | ICD-10-CM | POA: Insufficient documentation

## 2017-03-23 DIAGNOSIS — Z885 Allergy status to narcotic agent status: Secondary | ICD-10-CM | POA: Insufficient documentation

## 2017-03-23 DIAGNOSIS — I2510 Atherosclerotic heart disease of native coronary artery without angina pectoris: Secondary | ICD-10-CM | POA: Insufficient documentation

## 2017-03-23 DIAGNOSIS — Z9889 Other specified postprocedural states: Secondary | ICD-10-CM | POA: Diagnosis not present

## 2017-03-23 DIAGNOSIS — Z9049 Acquired absence of other specified parts of digestive tract: Secondary | ICD-10-CM | POA: Diagnosis not present

## 2017-03-23 DIAGNOSIS — Z8042 Family history of malignant neoplasm of prostate: Secondary | ICD-10-CM | POA: Diagnosis not present

## 2017-03-23 DIAGNOSIS — G4730 Sleep apnea, unspecified: Secondary | ICD-10-CM | POA: Diagnosis not present

## 2017-03-23 DIAGNOSIS — C61 Malignant neoplasm of prostate: Secondary | ICD-10-CM | POA: Diagnosis not present

## 2017-03-23 DIAGNOSIS — E1140 Type 2 diabetes mellitus with diabetic neuropathy, unspecified: Secondary | ICD-10-CM | POA: Diagnosis not present

## 2017-03-23 DIAGNOSIS — I10 Essential (primary) hypertension: Secondary | ICD-10-CM | POA: Insufficient documentation

## 2017-03-23 HISTORY — DX: Malignant neoplasm of prostate: C61

## 2017-03-23 NOTE — Progress Notes (Signed)
GU Location of Tumor / Histology: biochemical recurrent prostatic adenocarcinoma  If Prostate Cancer, Gleason Score is (3 + 4) and PSA is (0.08) immediately post op.  Raymond Mays had a radical prostatectomy on 11/20/2007. With a positive right apical margin and a questionable positive margin at the left lateral anterior prostate.   03/2012  PSA  0.2 04/2013  PSA  0.3 2017  PSA  0.8 09/10/16  PSA  0.4 03/09/2017 PSA  0.74    Past/Anticipated interventions by urology, if any: prostatectomy, close PSA surveillance, referral to radiation oncology   ADT??  Past/Anticipated interventions by medical oncology, if any: no  Weight changes, if any: no  Bowel/Bladder complaints, if any: Denies dysuria, hematuria, leakage or incontinence. Reports nocturia.  Nausea/Vomiting, if any: no  Pain issues, if any:  no  SAFETY ISSUES:  Prior radiation? no  Pacemaker/ICD? no  Possible current pregnancy? no  Is the patient on methotrexate? no  Current Complaints / other details:  72 year old male. Father with hx of prostate ca. Married with one daughter and one son.

## 2017-03-23 NOTE — Progress Notes (Signed)
Radiation Oncology         [phone] ________________________________  Initial Outpatient Consultation  Name: Raymond Mays MRN: [id number]  Date: 03/23/2017  DOB: 1945-12-09  [id number], Collier Salina, MD  Raynelle Bring, MD   REFERRING PHYSICIAN: Raynelle Bring, MD  DIAGNOSIS: 72 y.o. 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    ICD-10-CM   1. Local recurrence of prostate cancer [facility]) C61 Ambulatory referral to Radiation Oncology    HISTORY OF PRESENT ILLNESS: Raymond Mays is a 72 y.o. male who was initially diagnosed with a Gleason 3+4 = 7, pT2 cN0 MX prostate adenocarcinoma following radical prostatectomy in [place], [place] on 11/20/2007.  His surgical pathology demonstrated a positive right apical margin and a questionable positive margin at the left lateral anterior prostate.  His initial postoperative PSA was 0.08 and has continued to gradually increase since that time.  He voids well and has maintained good continence since surgery.  He has previously been followed by his primary care physician in [place], [place].  He and his wife recently relocated to [place], [place] where he established care with Dr. Sandi Mariscal who subsequently referred him to Dr. Alinda Money due to a postoperative PSA of 0.4 on 09/10/2016.  A repeat PSA on March 09, 2017 demonstrated further elevation at 0.74.  03/2012             PSA                 0.2 04/2013             PSA                 0.3 2017                PSA                 0.8 09/10/16              PSA                 0.4 03/09/2017        PSA                 0.74    The patient reviewed the surgical pathology and subsequent PSA results with his urologist and he has kindly been referred today for discussion of potential salvage radiation treatment options. He is accompanied by his wife.   PREVIOUS RADIATION THERAPY: No  PAST MEDICAL  HISTORY:  Past Medical History:  Diagnosis Date  . Alcohol abuse   . CAD (coronary artery disease)    Stent to circ 2007, patent on cath 2015.  NL EF  . Depression   . Diabetes mellitus without complication ([place])   . Hyperlipemia   . Hypertension   . Neuropathy   . Prostate cancer ([place])   . Sleep apnea       PAST SURGICAL HISTORY: Past Surgical History:  Procedure Laterality Date  . CHOLECYSTECTOMY    . COLONOSCOPY    . heart stent    . KNEE SURGERY     bakers cyst  . PROSTATECTOMY    . TONSILLECTOMY      FAMILY HISTORY:  Family History  Problem Relation Age of Onset  . Heart attack Mother        Died 80s  . Prostate cancer Father   . Diabetes Father   . Suicidality Maternal  Grandmother   . Prostate cancer Brother     SOCIAL HISTORY: He and his wife recently relocated from [place], [place] to [place], [place].  They are in the process of selling their home in [place]- anticipating closing the week of 04/11/17.  They have 2 grown children, one is a PA working in pediatrics and lives in [place] and the other child lives in [place], [place].   Social History   Socioeconomic History  . Marital status: Married    Spouse name: Jeani Hawking  . Number of children: 2  . Years of education: Not on file  . Highest education level: Not on file  Social Needs  . Financial resource strain: Not hard at all  . Food insecurity - worry: Never true  . Food insecurity - inability: Never true  . Transportation needs - medical: No  . Transportation needs - non-medical: No  Occupational History  . Not on file  Tobacco Use  . Smoking status: Never Smoker  . Smokeless tobacco: Never Used  Substance and Sexual Activity  . Alcohol use: No  . Drug use: No  . Sexual activity: No  Other Topics Concern  . Not on file  Social History Narrative   He is originally from [place]. He lived in [place]. 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    ALLERGIES: Other and Percocet [oxycodone-acetaminophen]  MEDICATIONS:  Current Outpatient Medications  Medication Sig Dispense Refill  . allopurinol (ZYLOPRIM) 300 MG tablet Take 300 mg by mouth daily.    . Canagliflozin-Metformin HCl ER (INVOKAMET XR) 150-500 MG TB24 Take 2 tablets daily by mouth. 60 tablet 3  . clopidogrel (PLAVIX) 75 MG tablet Take 1 tablet (75 mg total) by mouth daily. 30 tablet 5  . DULoxetine (CYMBALTA) 20 MG capsule Take one capsule daily for 1 week and than twice daily 60 capsule 1  . Evolocumab (REPATHA) 140 MG/ML SOSY Inject 140 mg into the skin every 14 (fourteen) days.    . Exenatide ER 2 MG PEN Inject into the skin.    Marland Kitchen HUMALOG KWIKPEN 100 UNIT/ML KiwkPen     . Insulin Degludec (TRESIBA FLEXTOUCH) 200 UNIT/ML SOPN Tresiba FlexTouch U-200 insulin 200 unit/mL (3 mL) subcutaneous pen  Inject 50 units every day by subcutaneous route.    Marland Kitchen losartan (COZAAR) 50 MG tablet Take 50 mg by mouth daily.    Marland Kitchen omeprazole (PRILOSEC) 40 MG capsule Take 20 mg by mouth 2 (two) times daily.     . rosuvastatin (CRESTOR) 10 MG tablet Take 10 mg by mouth daily.    . Semaglutide (OZEMPIC) 0.25 or 0.5 MG/DOSE SOPN Inject 0.5 mg once a week into the skin. 1 pen 2  . venlafaxine (EFFEXOR) 75 MG tablet Take 75 mg by mouth 2 (two) times daily with a meal.    . albuterol (PROAIR HFA) 108 (90 Base) MCG/ACT inhaler Inhale 1-2 puffs into the lungs every 6 (six) hours as needed for wheezing.    Marland Kitchen aspirin EC 325 MG EC tablet Take 1 tablet (325 mg total) by mouth daily. Please continue Aspirin and plavix for 3 months, then plavix alone (Patient not taking: Reported on 03/23/2017) 30 tablet 3  . NONFORMULARY OR COMPOUNDED ITEM Apply 1 application topically 3 (three) times daily as needed (MUSCLE SORENESS). 5,000-LIDOCAINE 5% BASE GEL IN A PUMP     No current facility-administered medications for this encounter.  REVIEW OF SYSTEMS:  On review of systems,  the patient reports that he is doing well overall. He denies any chest pain, shortness of breath, cough, fevers, chills, night sweats, or unintended weight changes. He denies any bowel disturbances, and denies abdominal pain, nausea or vomiting. He denies any new musculoskeletal or joint aches or pains. His IPSS was 3, indicating mild urinary symptoms with nocturia. He denies dysuria, hematuria, post-void leakage or incontinence. He reports severe erectile dysfunction. A complete review of systems is obtained and is otherwise negative.    PHYSICAL EXAM:  Wt Readings from Last 3 Encounters:  03/23/17 252 lb 6.4 oz (114.5 kg)  01/07/17 260 lb 9.6 oz (118.2 kg)  12/13/16 264 lb 12.8 oz (120.1 kg)   Temp Readings from Last 3 Encounters:  03/23/17 98 F (36.7 C) (Oral)  11/17/16 98 F (36.7 C) (Oral)   BP Readings from Last 3 Encounters:  03/23/17 (!) 123/96  01/07/17 (!) 144/90  12/13/16 128/88   Pulse Readings from Last 3 Encounters:  03/23/17 98  01/07/17 (!) 112  12/13/16 (!) 108   Pain Assessment Pain Score: 0-No pain/10  In general this is a well appearing Caucasian man in no acute distress. He is alert and oriented x4 and appropriate throughout the examination. HEENT reveals that the patient is normocephalic, atraumatic. Skin is intact without any evidence of gross lesions. Cardiovascular exam reveals a regular rate and rhythm, no clicks rubs or murmurs are auscultated. Chest is clear to auscultation bilaterally. Lymphatic assessment is performed and does not reveal any adenopathy in the cervical, supraclavicular, axillary, or inguinal chains. Abdomen has active bowel sounds in all quadrants and is intact. The abdomen is soft, non tender, non distended. Lower extremities are negative for pretibial pitting edema, deep calf tenderness, cyanosis or clubbing.   KPS = 100  100 - Normal; no complaints; no evidence of disease. 90   - Able to carry on normal activity; minor signs or  symptoms of disease. 80   - Normal activity with effort; some signs or symptoms of disease. 26   - Cares for self; unable to carry on normal activity or to do active work. 60   - Requires occasional assistance, but is able to care for most of his personal needs. 50   - Requires considerable assistance and frequent medical care. 63   - Disabled; requires special care and assistance. 17   - Severely disabled; hospital admission is indicated although death not imminent. 3   - Very sick; hospital admission necessary; active supportive treatment necessary. 10   - Moribund; fatal processes progressing rapidly. 0     - Dead  Karnofsky DA, Abelmann Hutto, Craver LS and Burchenal [facility] [phone]) The use of the nitrogen mustards in the palliative treatment of carcinoma: with particular reference to bronchogenic carcinoma Cancer 1 [phone]  LABORATORY DATA:  Lab Results  Component Value Date   WBC 5.7 11/17/2016   HGB 14.3 11/17/2016   HCT 43.9 11/17/2016   MCV 89.0 11/17/2016   PLT 183 11/17/2016   Lab Results  Component Value Date   NA 141 11/17/2016   K 3.7 11/17/2016   CL 105 11/17/2016   CO2 29 11/17/2016   Lab Results  Component Value Date   ALT 23 11/15/2016   AST 20 11/15/2016   ALKPHOS 77 11/15/2016   BILITOT 1.4 (H) 11/15/2016     RADIOGRAPHY: No results found.    IMPRESSION/PLAN: 1. 72 y.o. gentleman with biochemical recurrence of Gleason  3+4 = 7, pT2 cN0 MX prostate adenocarcinoma with PSA of 0.74 s/p radical prostatectomy in 2009. Today we reviewed the findings and workup thus far.  We discussed the natural history of prostate cancer.  We reviewed the the implications of positive margins, extracapsular extension, and seminal vesicle involvement on the risk of prostate cancer recurrence. We reviewed some of the evidence suggesting an advantage for patients who undergo adjuvant radiotherapy in the setting in terms of disease control and overall survival. We also discussed some of the  dilemmas related to the available evidence.  We discussed the SWOG trial which did show an improvement in disease-free survival as well as overall survival using adjuvant radiotherapy. However, we discussed the fact that the study did not carefully control the usage of adjuvant radiotherapy in the observation arm. There is increasing evidence that careful surveillance with ultrasensitive PSA may provide an opportunity for early salvage in patients who undergo observation, which can lead to excellent results in terms of disease control and survival. We discussed radiation treatment directed to the prostatic fossa with regard to the logistics and delivery of external beam radiation treatment. We anticipate a 7.5 week course of daily treatments for a total of 38 fractions.  We discussed the role of androgen deprivation therapy in combination with radiotherapy and the risks and benefits as well as side effects associated with this treatment.    At the end of our conversation, the patient would like to proceed with salvage radiotherapy alone.  We will share our findings with Dr. Alinda Money.  He is scheduled for CT SIM/planning on 03/30/17 in anticipation of beginning treatment on March 11th, 2019.  Since he lives near [facility], he may choose to be treated there with radiation.  We will offer consult with Dr. Orlene Erm there.  We enjoyed meeting with him today, and will look forward to participating in the care of this very nice gentleman.   We spent 60 minutes face to face with the patient and more than 50% of that time was spent in counseling and/or coordination of care.   Nicholos Johns, PA-C    Tyler Pita, MD  [facility] Direct Dial: [phone]  Fax: [phone] [email or web address]  Skype  [email or web address]  This document serves as a record of services personally performed by Tyler Pita, MD and Freeman Caldron, PA-C. It was created on their behalf by Rae Lips, a  trained medical scribe. The creation of this record is based on the scribe's personal observations and the providers' statements to them. This document has been checked and approved by the attending providers.

## 2017-03-23 NOTE — Progress Notes (Signed)
See progress note under physician encounter. 

## 2017-03-24 ENCOUNTER — Telehealth: Payer: Self-pay | Admitting: Radiation Oncology

## 2017-03-24 NOTE — Telephone Encounter (Signed)
Received voicemail message from patient's wife requesting return call about her husband's desire to have radiation treatment in [place]. Phoned Dannette Barbara back. Explained the referral has been made to Dr. Orlene Erm. Went onto explain her husband's record have been faxed to Dr. Orlene Erm and once they are reviewed his office will call us with their appointment. Jeani Hawking understands Enid Derry will contact her with that appointment date and time asap. Also, Jeani Hawking reports she and her husband will be out of town in [facility] February 25-March 4.

## 2017-03-28 ENCOUNTER — Telehealth: Payer: Self-pay | Admitting: *Deleted

## 2017-03-28 ENCOUNTER — Telehealth: Payer: Self-pay | Admitting: Medical Oncology

## 2017-03-28 NOTE — Telephone Encounter (Signed)
Spoke with Raymond Mays to introduce myself as the prostate nurse navigator and my role. I was unable to meet him the day he consulted with Dr. Tammi Klippel. He states his visit went well but has chosen to get his radiation in [place] with Dr. Orlene Erm. He has an appointment 03/31/17. I wished him well and asked him to call me if I can be of assistance in the future. He voiced understanding.

## 2017-03-28 NOTE — Telephone Encounter (Signed)
CALLED PATIENT TO INFORM OF APPT. WITH DR. PALERMO ON 03-31-17  @ 1:45 PM, SPOKE WITH PATIENT AND HE IS AWARE OF THIS APPT.

## 2017-03-31 ENCOUNTER — Ambulatory Visit: Payer: Medicare Other | Admitting: Sports Medicine

## 2017-04-07 ENCOUNTER — Ambulatory Visit (HOSPITAL_COMMUNITY): Payer: Self-pay | Admitting: Licensed Clinical Social Worker

## 2017-04-11 ENCOUNTER — Ambulatory Visit: Payer: Medicare Other | Admitting: Neurology

## 2017-04-19 ENCOUNTER — Ambulatory Visit: Payer: Self-pay | Admitting: Neurology

## 2017-05-03 ENCOUNTER — Other Ambulatory Visit (HOSPITAL_COMMUNITY): Payer: Self-pay | Admitting: Psychiatry

## 2017-05-03 DIAGNOSIS — F331 Major depressive disorder, recurrent, moderate: Secondary | ICD-10-CM

## 2017-05-09 ENCOUNTER — Other Ambulatory Visit (HOSPITAL_COMMUNITY): Payer: Self-pay | Admitting: Psychiatry

## 2017-05-09 ENCOUNTER — Other Ambulatory Visit (HOSPITAL_COMMUNITY): Payer: Self-pay

## 2017-05-09 DIAGNOSIS — F331 Major depressive disorder, recurrent, moderate: Secondary | ICD-10-CM

## 2017-05-09 MED ORDER — DULOXETINE HCL 20 MG PO CPEP: ORAL_CAPSULE | ORAL | 1 refills | Status: DC

## 2017-05-09 NOTE — Telephone Encounter (Signed)
Seen once Needs appointment to refill meds

## 2017-05-10 ENCOUNTER — Telehealth (HOSPITAL_COMMUNITY): Payer: Self-pay

## 2017-05-10 NOTE — Telephone Encounter (Signed)
Patients wife called, the PCP would like you to take over prescribing the Effexor. Patient is on 75 mg BID. Please review and advise, thank you

## 2017-05-11 ENCOUNTER — Other Ambulatory Visit (HOSPITAL_COMMUNITY): Payer: Self-pay | Admitting: Psychiatry

## 2017-05-11 NOTE — Telephone Encounter (Signed)
Okay.  Call Effexor 75 mg twice a day.

## 2017-05-12 NOTE — Telephone Encounter (Signed)
I called patients wife to verify the pharmacy and she said they found a bottle of his medication in his travel bag so they do not need it right now. I told her to call back if she runs out before appointment

## 2017-06-06 ENCOUNTER — Other Ambulatory Visit (HOSPITAL_COMMUNITY): Payer: Self-pay

## 2017-06-06 MED ORDER — VENLAFAXINE HCL 75 MG PO TABS: 75.0000 mg | ORAL_TABLET | Freq: Two times a day (BID) | ORAL | 0 refills | Status: DC

## 2017-06-08 ENCOUNTER — Other Ambulatory Visit: Payer: Self-pay

## 2017-06-08 MED ORDER — INSULIN DEGLUDEC 200 UNIT/ML ~~LOC~~ SOPN: 50.0000 [IU] | PEN_INJECTOR | Freq: Every day | SUBCUTANEOUS | 3 refills | Status: DC

## 2017-06-15 ENCOUNTER — Encounter (HOSPITAL_COMMUNITY): Payer: Self-pay | Admitting: Psychiatry

## 2017-06-15 ENCOUNTER — Ambulatory Visit (INDEPENDENT_AMBULATORY_CARE_PROVIDER_SITE_OTHER): Payer: Medicare Other | Admitting: Psychiatry

## 2017-06-15 VITALS — BP 120/78 | HR 100 | Ht 73.0 in | Wt 253.8 lb

## 2017-06-15 DIAGNOSIS — R450 Nervousness: Secondary | ICD-10-CM

## 2017-06-15 DIAGNOSIS — R413 Other amnesia: Secondary | ICD-10-CM

## 2017-06-15 DIAGNOSIS — F331 Major depressive disorder, recurrent, moderate: Secondary | ICD-10-CM | POA: Diagnosis not present

## 2017-06-15 DIAGNOSIS — F411 Generalized anxiety disorder: Secondary | ICD-10-CM | POA: Diagnosis not present

## 2017-06-15 MED ORDER — VENLAFAXINE HCL 75 MG PO TABS: 75.0000 mg | ORAL_TABLET | Freq: Two times a day (BID) | ORAL | 0 refills | Status: DC

## 2017-06-15 MED ORDER — DULOXETINE HCL 60 MG PO CPEP: 60.0000 mg | ORAL_CAPSULE | Freq: Every day | ORAL | 0 refills | Status: DC

## 2017-06-15 NOTE — Progress Notes (Signed)
[facility] MD/PA/NP OP Progress Note  06/15/2017 2:57 PM Raymond Mays  MRN:  [id number]  Chief Complaint: I am doing better.  Still have anxiety but not as bad.  HPI: Raymond Mays is a 72 year old Caucasian retired married man who was seen first time 3 months ago.  He has a history of depression and anxiety and recently his anxiety has been worsened.  He was referred from Dr. Sandi Mariscal.  Today patient came with his wife.  We started him on Cymbalta and Effexor.  His Wellbutrin was discontinued.  Patient seen improvement in his anxiety.  He is still anxious and nervous but his wife endorsed much improvement since the last visit.  He is able to watch TV, play with dog, sleeping less during the day.  He still have decreased energy and sometimes he feels fatigue but his social interactions are improved.  Patient has multiple health issues.  He is getting radiation treatment for his prostate cancer.  He admitted there are days when he feels very tired and have sleep issues because of radiation.  He has to go to bathroom in the night.  Patient has TIA and mild memory impairment.  He does not drive because of diplopia.  His wife takes him to the doctor's appointment.  We have recommended to see a therapist but due to his radiation he had to cancel his therapist appointments.  He wanted to finish his radiation and then consider counseling.  His wife also endorsed that they are expecting a grandbaby any day and they may go to [place] to see their daughter and grandbaby.  Patient is a poor historian.  Most of the information was obtained through his wife.  Patient has no rash, itching, tremors or shakes.  He is taking Effexor and Cymbalta.  He is not drinking.  He is using CPAP machine.  Patient is no longer taking Wellbutrin.  He used to sleep 14 hours but now he is up during the day.  Visit Diagnosis:    ICD-10-CM   1. GAD (generalized anxiety disorder) F41.1 venlafaxine (EFFEXOR) 75 MG tablet  2. MDD (major depressive  disorder), recurrent episode, moderate (HCC) F33.1 DULoxetine (CYMBALTA) 60 MG capsule    venlafaxine (EFFEXOR) 75 MG tablet    Past Psychiatric History: Reviewed. Patient has depression and he has history of suicidal thoughts but no attempt.  He was seeing psychiatrist at [place] and prescribed Abilify and Effexor.  Letter his primary care physician discontinued Abilify and tried him on Wellbutrin.  Patient has history of heavy drinking which includes binge but denies any seizures blackouts or any rehabilitation.    Past Medical History:  Past Medical History:  Diagnosis Date  . Alcohol abuse   . CAD (coronary artery disease)    Stent to circ 2007, patent on cath 2015.  NL EF  . Depression   . Diabetes mellitus without complication ([place])   . Hyperlipemia   . Hypertension   . Neuropathy   . Prostate cancer ([place])   . Sleep apnea     Past Surgical History:  Procedure Laterality Date  . CHOLECYSTECTOMY    . COLONOSCOPY    . heart stent    . KNEE SURGERY     bakers cyst  . PROSTATECTOMY    . TONSILLECTOMY      Family Psychiatric History: Reviewed.  Family History:  Family History  Problem Relation Age of Onset  . Heart attack Mother        Died 80s  .  Prostate cancer Father   . Diabetes Father   . Suicidality Maternal Grandmother   . Prostate cancer Brother     Social History:  Social History   Socioeconomic History  . Marital status: Married    Spouse name: Raymond Mays  . Number of children: 2  . Years of education: Not on file  . Highest education level: Not on file  Occupational History  . Not on file  Social Needs  . Financial resource strain: Not hard at all  . Food insecurity:    Worry: Never true    Inability: Never true  . Transportation needs:    Medical: No    Non-medical: No  Tobacco Use  . Smoking status: Never Smoker  . Smokeless tobacco: Never Used  Substance and Sexual Activity  . Alcohol use: No  . Drug use: No  . Sexual activity: Never   Lifestyle  . Physical activity:    Days per week: 0 days    Minutes per session: 0 min  . Stress: To some extent  Relationships  . Social connections:    Talks on phone: Once a week    Gets together: Once a week    Attends religious service: Never    Active member of club or organization: Yes    Attends meetings of clubs or organizations: Never    Relationship status: Married  Other Topics Concern  . Not on file  Social History Narrative   He is originally from [place]. He lived in [place]. 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    Allergies:  Allergies  Allergen Reactions  . Other     Oak pollen  . Percocet [Oxycodone-Acetaminophen] Itching    Metabolic Disorder Labs: Lab Results  Component Value Date   HGBA1C 10.4 (H) 11/16/2016   MPG 251.78 11/16/2016   No results found for: PROLACTIN Lab Results  Component Value Date   CHOL 145 11/16/2016   TRIG 193 (H) 11/16/2016   HDL 39 (L) 11/16/2016   CHOLHDL 3.7 11/16/2016   VLDL 39 11/16/2016   LDLCALC 67 11/16/2016   Lab Results  Component Value Date   TSH 2.734 11/16/2016    Therapeutic Level Labs: No results found for: LITHIUM No results found for: VALPROATE No components found for:  CBMZ  Current Medications: Current Outpatient Medications  Medication Sig Dispense Refill  . albuterol (PROAIR HFA) 108 (90 Base) MCG/ACT inhaler Inhale 1-2 puffs into the lungs every 6 (six) hours as needed for wheezing.    Marland Kitchen allopurinol (ZYLOPRIM) 300 MG tablet Take 300 mg by mouth daily.    Marland Kitchen aspirin EC 325 MG EC tablet Take 1 tablet (325 mg total) by mouth daily. Please continue Aspirin and plavix for 3 months, then plavix alone (Patient not taking: Reported on 03/23/2017) 30 tablet 3  . Canagliflozin-Metformin HCl ER (INVOKAMET XR) 150-500 MG TB24 Take 2 tablets daily by mouth. 60 tablet 3  . clopidogrel (PLAVIX) 75 MG tablet Take 1 tablet (75 mg total) by mouth  daily. 30 tablet 5  . DULoxetine (CYMBALTA) 20 MG capsule Take one capsule daily for 1 week and than twice daily 60 capsule 1  . Evolocumab (REPATHA) 140 MG/ML SOSY Inject 140 mg into the skin every 14 (fourteen) days.    . Exenatide ER 2 MG PEN Inject into the skin.    Marland Kitchen HUMALOG KWIKPEN 100 UNIT/ML KiwkPen     . Insulin Degludec (TRESIBA  FLEXTOUCH) 200 UNIT/ML SOPN Inject 50 Units into the skin daily. 5 pen 3  . losartan (COZAAR) 50 MG tablet Take 50 mg by mouth daily.    . NONFORMULARY OR COMPOUNDED ITEM Apply 1 application topically 3 (three) times daily as needed (MUSCLE SORENESS). 5,000-LIDOCAINE 5% BASE GEL IN A PUMP    . omeprazole (PRILOSEC) 40 MG capsule Take 20 mg by mouth 2 (two) times daily.     . rosuvastatin (CRESTOR) 10 MG tablet Take 10 mg by mouth daily.    . Semaglutide (OZEMPIC) 0.25 or 0.5 MG/DOSE SOPN Inject 0.5 mg once a week into the skin. 1 pen 2  . venlafaxine (EFFEXOR) 75 MG tablet Take 1 tablet (75 mg total) by mouth 2 (two) times daily with a meal. 60 tablet 0   No current facility-administered medications for this visit.      Musculoskeletal: Strength & Muscle Tone: within normal limits Gait & Station: normal Patient leans: N/A  Psychiatric Specialty Exam: Review of Systems  Constitutional: Negative.   HENT: Negative.   Skin: Negative.   Neurological: Negative for tingling.  Psychiatric/Behavioral: Positive for memory loss. The patient is nervous/anxious.     Blood pressure 120/78, pulse 100, height 6\' 1"  (1.854 m), weight 253 lb 12.8 oz (115.1 kg), SpO2 98 %.There is no height or weight on file to calculate BMI.  General Appearance: Casual and Superficially cooperative.  Poor historian.  Eye Contact:  Fair  Speech:  Slow  Volume:  Decreased  Mood:  Anxious  Affect:  Constricted  Thought Process:  Linear  Orientation:  Full (Time, Place, and Person)  Thought Content: Rumination   Suicidal Thoughts:  No  Homicidal Thoughts:  No  Memory:   Immediate;   Fair Recent;   Fair Remote;   Fair  Judgement:  Good  Insight:  Present  Psychomotor Activity:  Decreased  Concentration:  Concentration: Fair and Attention Span: Fair  Recall:  [employer] of Knowledge: Fair  Language: Fair  Akathisia:  No  Handed:  Right  AIMS (if indicated): not done  Assets:  Communication Skills Desire for Improvement Housing Resilience Social Support  ADL's:  Intact  Cognition: Impaired,  Mild  Sleep:  Improved.   Screenings: PHQ2-9     CONSULT from 03/23/2017 in [facility] Radiation Oncology Nutrition from 12/13/2016 in [facility]  PHQ-2 Total Score  0  6  PHQ-9 Total Score  -  15       Assessment and Plan: Depressive disorder, recurrent.  Anxiety disorder NOS.  Patient doing improvement from the past.  His wife also endorsed that Cymbalta helping his anxiety.  I recommended to try Cymbalta 60 mg daily and continue Effexor 150 mg daily.  Patient has no tremors, serotonin syndrome or any rash.  Once he finished radiation he will scheduled to see a therapist for coping skills and neurology for memory issues and sleep apnea.  Discussed medication side effects and benefits.  Recommended to call us back if he has any question, concern if he feels worsening of the symptoms.  Follow-up in 3 months.   Kathlee Nations, MD 06/15/2017, 2:57 PM

## 2017-06-27 ENCOUNTER — Telehealth (HOSPITAL_COMMUNITY): Payer: Self-pay

## 2017-06-27 ENCOUNTER — Other Ambulatory Visit (HOSPITAL_COMMUNITY): Payer: Self-pay | Admitting: Psychiatry

## 2017-06-27 NOTE — Telephone Encounter (Signed)
He has memory impairment and diplopia.  Not safe to drive.  He should see neurology and letter should come from primary care physician or neurology.

## 2017-06-27 NOTE — Telephone Encounter (Signed)
Patients wife is calling, she reports patient is giving her a really hard time about driving. She said that it would be helpful to have a note from you saying that you do not want the patient driving. Please review and advise, thank you

## 2017-07-13 ENCOUNTER — Other Ambulatory Visit: Payer: Self-pay | Admitting: Endocrinology

## 2017-08-24 ENCOUNTER — Ambulatory Visit: Payer: Medicare Other | Admitting: Sports Medicine

## 2017-08-24 ENCOUNTER — Encounter: Payer: Self-pay | Admitting: Sports Medicine

## 2017-08-24 DIAGNOSIS — E1142 Type 2 diabetes mellitus with diabetic polyneuropathy: Secondary | ICD-10-CM | POA: Diagnosis not present

## 2017-08-24 DIAGNOSIS — M79674 Pain in right toe(s): Secondary | ICD-10-CM | POA: Diagnosis not present

## 2017-08-24 DIAGNOSIS — L84 Corns and callosities: Secondary | ICD-10-CM | POA: Diagnosis not present

## 2017-08-24 DIAGNOSIS — M79675 Pain in left toe(s): Secondary | ICD-10-CM | POA: Diagnosis not present

## 2017-08-24 DIAGNOSIS — B351 Tinea unguium: Secondary | ICD-10-CM

## 2017-08-24 NOTE — Progress Notes (Signed)
Subjective: Raymond Mays is a 72 y.o. male patient with history of diabetes who returns to office today complaining of long, painful nails and callus while ambulating in shoes; unable to trim. Patient states that the glucose reading this morning was not recorded. Last check was several months ago when he saw his PCP.  Denies any other issues.   Patient Active Problem List   Diagnosis Date Noted  . Local recurrence of prostate cancer ([place]) 03/23/2017  . Dyslipidemia 01/07/2017  . Complex sleep apnea syndrome 01/03/2017  . Cheyne-Stokes breathing disorder 01/03/2017  . Sleep apnea   . Neuropathy   . Hypertension   . Hyperlipemia   . Diabetes mellitus without complication ([place])   . Depression   . Cancer ([place])   . CAD (coronary artery disease)   . Alcohol abuse   . Controlled diabetes mellitus type 2 with complications ([place])   . Gastroesophageal reflux disease   . TIA (transient ischemic attack) 11/15/2016  . Diplopia 11/15/2016  . Type 2 diabetes mellitus with complication, with long-term current use of insulin ([place]) 10/08/2016  . Coronary artery disease involving native coronary artery of native heart without angina pectoris 10/08/2016  . SOB (shortness of breath) 10/08/2016  . Excessive daytime sleepiness 09/30/2016  . Severe episode of recurrent major depressive disorder, without psychotic features ([place]) 09/30/2016  . Syncope, near 09/30/2016  . OSA (obstructive sleep apnea) 09/30/2016   Current Outpatient Medications on File Prior to Visit  Medication Sig Dispense Refill  . albuterol (PROAIR HFA) 108 (90 Base) MCG/ACT inhaler Inhale 1-2 puffs into the lungs every 6 (six) hours as needed for wheezing.    Marland Kitchen allopurinol (ZYLOPRIM) 300 MG tablet Take 300 mg by mouth daily.    Marland Kitchen aspirin EC 325 MG EC tablet Take 1 tablet (325 mg total) by mouth daily. Please continue Aspirin and plavix for 3 months, then plavix alone 30 tablet 3  . Canagliflozin-Metformin HCl ER (INVOKAMET XR) 150-500  MG TB24 Take 2 tablets daily by mouth. 60 tablet 3  . clopidogrel (PLAVIX) 75 MG tablet Take 1 tablet (75 mg total) by mouth daily. 30 tablet 5  . DULoxetine (CYMBALTA) 60 MG capsule Take 1 capsule (60 mg total) by mouth daily. 90 capsule 0  . Evolocumab (REPATHA) 140 MG/ML SOSY Inject 140 mg into the skin every 14 (fourteen) days.    . Exenatide ER 2 MG PEN Inject into the skin.    Marland Kitchen HUMALOG KWIKPEN 100 UNIT/ML KiwkPen     . Insulin Degludec (TRESIBA FLEXTOUCH) 200 UNIT/ML SOPN Inject 50 Units into the skin daily. 5 pen 3  . insulin lispro (HUMALOG KWIKPEN) 100 UNIT/ML KiwkPen INJECT 18 UNITS AT BREAKFAST, 18 UNITS AT LUNCH AND 20 UNITS AT DINNER 15 pen 3  . losartan (COZAAR) 50 MG tablet Take 50 mg by mouth daily.    . NONFORMULARY OR COMPOUNDED ITEM Apply 1 application topically 3 (three) times daily as needed (MUSCLE SORENESS). 5,000-LIDOCAINE 5% BASE GEL IN A PUMP    . omeprazole (PRILOSEC) 40 MG capsule Take 20 mg by mouth 2 (two) times daily.     . rosuvastatin (CRESTOR) 10 MG tablet Take 10 mg by mouth daily.    . Semaglutide (OZEMPIC) 0.25 or 0.5 MG/DOSE SOPN Inject 0.5 mg once a week into the skin. 1 pen 2  . venlafaxine (EFFEXOR) 75 MG tablet Take 1 tablet (75 mg total) by mouth 2 (two) times daily with a meal. 180 tablet 0   No current facility-administered  medications on file prior to visit.    Allergies  Allergen Reactions  . Other     Oak pollen  . Percocet [Oxycodone-Acetaminophen] Itching    No results found for this or any previous visit (from the past 2160 hour(s)).  Objective: General: Patient is awake, alert, and oriented x 3 and in no acute distress.  Integument: Skin is warm, dry and supple bilateral. Nails are tender, long, thickened and  dystrophic with subungual debris, consistent with onychomycosis, 1-5 on right and 2-5 on left. Patient has a history of previous nail removal on left great toe. No signs of infection. No open lesions. There is a interdigital  corn/callus at the medial aspect of the left fifth toe and medial right and left medial 1st toe with no surrounding signs of infection. Remaining integument unremarkable.  Vasculature:  Dorsalis Pedis pulse 1/4 bilateral. Posterior Tibial pulse 1 /4 bilateral.  Capillary fill time <3 sec 1-5 bilateral. Scant hair growth to the level of the digits. Temperature gradient within normal limits. Mild varicosities present bilateral. No edema present bilateral.   Neurology: The patient has absent sensation measured with a 5.07/10g Semmes Weinstein Monofilament at toes bilateral . Vibratory sensation diminished bilateral with tuning fork. No Babinski sign present bilateral.   Musculoskeletal: Asymptomatic hammertoe pedal deformities noted bilateral. Muscular strength 5/5 in all lower extremity muscular groups bilateral without pain on range of motion. No tenderness with calf compression bilateral.  Assessment and Plan: Problem List Items Addressed This Visit    None    Visit Diagnoses    Diabetic polyneuropathy associated with type 2 diabetes mellitus ([place])    -  Primary   Pain due to onychomycosis of toenails of both feet       Foot callus         -Examined patient. -Discussed and educated patient on diabetic foot care, especially with  regards to the vascular, neurological and musculoskeletal systems.  -Stressed the importance of good glycemic control and the detriment of not  controlling glucose levels in relation to the foot. -Mechanically debrided Callus at left fifth toe and medial right and left 1st toe using a sterile tissue nipper without incident and debrided mechanically all nails 1-5 on right, 2-5 on left using sterile nail nipper and filed with dremel without incident  -Answered all patient questions -Patient to return  in 3 months for at risk foot care -Patient advised to call the office if any problems or questions arise in the meantime.  Landis Martins, DPM

## 2017-09-22 ENCOUNTER — Ambulatory Visit (HOSPITAL_COMMUNITY): Payer: Self-pay | Admitting: Licensed Clinical Social Worker

## 2017-10-03 ENCOUNTER — Encounter: Payer: Self-pay | Admitting: Cardiology

## 2017-10-03 DIAGNOSIS — M109 Gout, unspecified: Secondary | ICD-10-CM | POA: Insufficient documentation

## 2017-10-03 DIAGNOSIS — M549 Dorsalgia, unspecified: Secondary | ICD-10-CM | POA: Insufficient documentation

## 2017-10-03 DIAGNOSIS — F419 Anxiety disorder, unspecified: Secondary | ICD-10-CM | POA: Insufficient documentation

## 2017-10-03 DIAGNOSIS — Z789 Other specified health status: Secondary | ICD-10-CM | POA: Insufficient documentation

## 2017-10-03 DIAGNOSIS — J309 Allergic rhinitis, unspecified: Secondary | ICD-10-CM | POA: Insufficient documentation

## 2017-10-03 DIAGNOSIS — G5620 Lesion of ulnar nerve, unspecified upper limb: Secondary | ICD-10-CM | POA: Insufficient documentation

## 2017-10-03 DIAGNOSIS — C61 Malignant neoplasm of prostate: Secondary | ICD-10-CM | POA: Insufficient documentation

## 2017-10-03 NOTE — Progress Notes (Signed)
Cardiology Office Note:    Date:  10/04/2017   ID:  Raymond Mays, DOB 09/13/1945, MRN [id number]  PCP:  Raymond Roger, MD  Cardiologist:  Raymond More, MD   Referring MD: Raymond Roger, MD  ASSESSMENT:    1. Coronary artery disease involving native coronary artery of native heart without angina pectoris   2. Hyperlipidemia, unspecified hyperlipidemia type   3. Diabetes mellitus without complication ([place])   4. Essential hypertension    PLAN:    In order of problems listed above:  Known CAD with recent ED evaluation advise cardiac follow-up and in view of his poorly controlled diabetes will undergo myocardial perfusion imaging pharmacologically my office and if he has evidence of ischemia high risk markers would benefit from repeat coronary angiography and revascularization.  For now he will continue medical therapy including clopidogrel high intensity statin and antihypertensive  Hyperlipidemia stable continue statin  Diabetes poorly controlled blood sugar in office today greater than 300 check because of weakness managed by his PCP  Stable continue his ARB  Follow-up in 4 to 6 weeks   Medication Adjustments/Labs and Tests Ordered: Current medicines are reviewed at length with the patient today.  Concerns regarding medicines are outlined above.  Orders Placed This Encounter  Procedures  . MYOCARDIAL PERFUSION IMAGING   No orders of the defined types were placed in this encounter.    No chief complaint on file.   History of Present Illness:    Raymond Mays is a 72 y.o. male who is being seen today for the evaluation of CAD with a history of T2 DM hypertension TIA, sleep apnea and hyperlipidemia seen by Dr Raymond Mays on 01/07/17 at the request of Raymond Roger, MD.  He has a history of stent placement to the circumflex in 2007.     He was treated in [facility]. Last catheterization was 2015. At that time he had 30% proximal diffuse LAD stenosis, patent  stent in circumflex, 50% obtuse marginal stenosis.   August 5 he was seen at [facility] with marked diarrhea and weakness.  He was brought by ambulance they are concerned he had ST abnormality with a known history of CAD initially portion is a cardiac patient.  He had no chest pain cardiac enzymes were normal and 2 EKG showed nonspecific changes.  He was evaluated including CBC CMP that were normal except for elevated glucose chest x-ray CT of the abdomen and pelvis unremarkable at discharge they were told to seek cardiology follow-up.  He has untreated sleep apnea does not tolerate CPAP and does not feel well with chronic fatigue.  His wife is very concerned he has had 2 episodes of syncope vasovagal in the past with choking but did not lose consciousness with his last episode of weakness with profound diarrhea.  Patient is quite adamant that he has had no chest pain shortness of breath palpitations syncope or TIA.  Past Medical History:  Diagnosis Date  . Alcohol abuse   . CAD (coronary artery disease)    Stent to circ 2007, patent on cath 2015.  NL EF  . CAD (coronary artery disease)   . Depression   . Diabetes mellitus without complication ([place])   . Gout   . History of TIA (transient ischemic attack)   . Hyperlipemia   . Hypertension   . Neuropathy   . Prostate cancer ([place])   . Prostate cancer ([place])   . Sleep apnea   . Stroke [facility])  Past Surgical History:  Procedure Laterality Date  . APPENDECTOMY    . CARDIAC CATHETERIZATION    . CHOLECYSTECTOMY    . COLONOSCOPY    . CORONARY ANGIOPLASTY    . heart stent    . KNEE SURGERY     bakers cyst  . PROSTATECTOMY    . TONSILLECTOMY      Current Medications: Current Meds  Medication Sig  . albuterol (PROAIR HFA) 108 (90 Base) MCG/ACT inhaler Inhale 1-2 puffs into the lungs every 6 (six) hours as needed for wheezing.  Marland Kitchen allopurinol (ZYLOPRIM) 300 MG tablet Take 300 mg by mouth daily.  . Canagliflozin-Metformin HCl ER  (INVOKAMET XR) 150-500 MG TB24 Take 2 tablets daily by mouth.  . clopidogrel (PLAVIX) 75 MG tablet Take 1 tablet (75 mg total) by mouth daily.  . DULoxetine (CYMBALTA) 60 MG capsule Take 1 capsule (60 mg total) by mouth daily.  . Exenatide ER 2 MG PEN Inject into the muscle once a week.  . Insulin Degludec (TRESIBA FLEXTOUCH) 200 UNIT/ML SOPN Inject 50 Units into the skin daily.  . insulin lispro (HUMALOG KWIKPEN) 100 UNIT/ML KiwkPen INJECT 18 UNITS AT BREAKFAST, 18 UNITS AT LUNCH AND 20 UNITS AT DINNER  . losartan (COZAAR) 50 MG tablet Take 50 mg by mouth daily.  . rosuvastatin (CRESTOR) 10 MG tablet Take 10 mg by mouth daily.  Marland Kitchen venlafaxine (EFFEXOR) 75 MG tablet Take 1 tablet (75 mg total) by mouth 2 (two) times daily with a meal.     Allergies:   Nitroglycerin; Other; and Percocet [oxycodone-acetaminophen]   Social History   Socioeconomic History  . Marital status: Married    Spouse name: Raymond Mays  . Number of children: 2  . Years of education: Not on file  . Highest education level: Not on file  Occupational History  . Not on file  Social Needs  . Financial resource strain: Not hard at all  . Food insecurity:    Worry: Never true    Inability: Never true  . Transportation needs:    Medical: No    Non-medical: No  Tobacco Use  . Smoking status: Never Smoker  . Smokeless tobacco: Never Used  Substance and Sexual Activity  . Alcohol use: No  . Drug use: No  . Sexual activity: Never  Lifestyle  . Physical activity:    Days per week: 0 days    Minutes per session: 0 min  . Stress: To some extent  Relationships  . Social connections:    Talks on phone: Once a week    Gets together: Once a week    Attends religious service: Never    Active member of club or organization: Yes    Attends meetings of clubs or organizations: Never    Relationship status: Married  Other Topics Concern  . Not on file  Social History Narrative   He is originally from [place]. He lived in  [place]. 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     Family History: The patient's family history includes Diabetes in his father; Heart attack in his mother; Prostate cancer in his brother and father; Suicidality in his maternal grandmother.  ROS:   Review of Systems  Constitution: Positive for malaise/fatigue.  HENT: Negative.   Eyes: Negative.   Cardiovascular: Negative.   Respiratory: Positive for snoring.   Endocrine: Negative.   Hematologic/Lymphatic: Bruises/bleeds easily.  Skin: Negative.   Gastrointestinal: Positive for  constipation and diarrhea.  Genitourinary: Negative.   Neurological: Negative.   Psychiatric/Behavioral: Positive for depression.  Allergic/Immunologic: Negative.    Please see the history of present illness.     All other systems reviewed and are negative.  EKGs/Labs/Other Studies Reviewed:    The following studies were reviewed today: Records reviewed from his PCP in the emergency room prior to the visit  EKG: I did not repeat an EKG after reviewing the 2 performed in the emergency room Recent Labs: 10/07/2016: Brain Natriuretic Peptide 7.0 11/15/2016: ALT 23 11/16/2016: TSH 2.734 11/17/2016: BUN 21; Creatinine, Ser 1.03; Hemoglobin 14.3; Platelets 183; Potassium 3.7; Sodium 141  Recent Lipid Panel    Component Value Date/Time   CHOL 145 11/16/2016 0422   TRIG 193 (H) 11/16/2016 0422   HDL 39 (L) 11/16/2016 0422   CHOLHDL 3.7 11/16/2016 0422   VLDL 39 11/16/2016 0422   LDLCALC 67 11/16/2016 0422   LDLDIRECT 133.0 11/10/2016 1449    Physical Exam:    VS:  BP 104/68 (Patient Position: Sitting, Cuff Size: Large)   Pulse 97   Ht 6\' 1"  (1.854 m)   Wt 250 lb (113.4 kg)   SpO2 94%   BMI 32.98 kg/m     Wt Readings from Last 3 Encounters:  10/04/17 250 lb (113.4 kg)  03/23/17 252 lb 6.4 oz (114.5 kg)  01/07/17 260 lb 9.6 oz (118.2 kg)     GEN:  Well nourished, well developed in  no acute distress HEENT: Normal NECK: No JVD; No carotid bruits LYMPHATICS: No lymphadenopathy CARDIAC: RRR, no murmurs, rubs, gallops RESPIRATORY:  Clear to auscultation without rales, wheezing or rhonchi  ABDOMEN: Soft, non-tender, non-distended MUSCULOSKELETAL:  No edema; No deformity  SKIN: Warm and dry NEUROLOGIC:  Alert and oriented x 3 PSYCHIATRIC:  Normal affect     Signed, Raymond More, MD  10/04/2017 6:07 PM    [facility]

## 2017-10-04 ENCOUNTER — Encounter: Payer: Self-pay | Admitting: Cardiology

## 2017-10-04 ENCOUNTER — Ambulatory Visit: Payer: Medicare Other | Admitting: Cardiology

## 2017-10-04 VITALS — BP 104/68 | HR 97 | Ht 73.0 in | Wt 250.0 lb

## 2017-10-04 DIAGNOSIS — I2510 Atherosclerotic heart disease of native coronary artery without angina pectoris: Secondary | ICD-10-CM | POA: Diagnosis not present

## 2017-10-04 DIAGNOSIS — E119 Type 2 diabetes mellitus without complications: Secondary | ICD-10-CM

## 2017-10-04 DIAGNOSIS — E785 Hyperlipidemia, unspecified: Secondary | ICD-10-CM | POA: Diagnosis not present

## 2017-10-04 DIAGNOSIS — I10 Essential (primary) hypertension: Secondary | ICD-10-CM

## 2017-10-04 NOTE — Patient Instructions (Signed)
Medication Instructions:  Your physician recommends that you continue on your current medications as directed. Please refer to the Current Medication list given to you today.   Labwork: None. Testing/Procedures: Your physician has requested that you have a lexiscan myoview. For further information please visit [email or web address]. Please follow instruction sheet, as given.    Follow-Up: Your physician recommends that you schedule a follow-up appointment in: 1 month.   Any Other Special Instructions Will Be Listed Below (If Applicable).     If you need a refill on your cardiac medications before your next appointment, please call your pharmacy.  Cardiac Nuclear Scan A cardiac nuclear scan is a test that measures blood flow to the heart when a person is resting and when he or she is exercising. The test looks for problems such as:  Not enough blood reaching a portion of the heart.  The heart muscle not working normally.  You may need this test if:  You have heart disease.  You have had abnormal lab results.  You have had heart surgery or angioplasty.  You have chest pain.  You have shortness of breath.  In this test, a radioactive dye (tracer) is injected into your bloodstream. After the tracer has traveled to your heart, an imaging device is used to measure how much of the tracer is absorbed by or distributed to various areas of your heart. This procedure is usually done at a hospital and takes 2-4 hours. Tell a health care provider about:  Any allergies you have.  All medicines you are taking, including vitamins, herbs, eye drops, creams, and over-the-counter medicines.  Any problems you or family members have had with the use of anesthetic medicines.  Any blood disorders you have.  Any surgeries you have had.  Any medical conditions you have.  Whether you are pregnant or may be pregnant. What are the risks? Generally, this is a safe procedure. However,  problems may occur, including:  Serious chest pain and heart attack. This is only a risk if the stress portion of the test is done.  Rapid heartbeat.  Sensation of warmth in your chest. This usually passes quickly.  What happens before the procedure?  Ask your health care provider about changing or stopping your regular medicines. This is especially important if you are taking diabetes medicines or blood thinners.  Remove your jewelry on the day of the procedure. What happens during the procedure?  An IV tube will be inserted into one of your veins.  Your health care provider will inject a small amount of radioactive tracer through the tube.  You will wait for 20-40 minutes while the tracer travels through your bloodstream.  Your heart activity will be monitored with an electrocardiogram (ECG).  You will lie down on an exam table.  Images of your heart will be taken for about 15-20 minutes.  You may be asked to exercise on a treadmill or stationary bike. While you exercise, your heart's activity will be monitored with an ECG, and your blood pressure will be checked. If you are unable to exercise, you may be given a medicine to increase blood flow to parts of your heart.  When blood flow to your heart has peaked, a tracer will again be injected through the IV tube.  After 20-40 minutes, you will get back on the exam table and have more images taken of your heart.  When the procedure is over, your IV tube will be removed. The procedure may vary among health  care providers and hospitals. Depending on the type of tracer used, scans may need to be repeated 3-4 hours later. What happens after the procedure?  Unless your health care provider tells you otherwise, you may return to your normal schedule, including diet, activities, and medicines.  Unless your health care provider tells you otherwise, you may increase your fluid intake. This will help flush the contrast dye from your  body. Drink enough fluid to keep your urine clear or pale yellow.  It is up to you to get your test results. Ask your health care provider, or the department that is doing the test, when your results will be ready. Summary  A cardiac nuclear scan measures the blood flow to the heart when a person is resting and when he or she is exercising.  You may need this test if you are at risk for heart disease.  Tell your health care provider if you are pregnant.  Unless your health care provider tells you otherwise, increase your fluid intake. This will help flush the contrast dye from your body. Drink enough fluid to keep your urine clear or pale yellow. This information is not intended to replace advice given to you by your health care provider. Make sure you discuss any questions you have with your health care provider. Document Released: 02/20/2004 Document Revised: 01/28/2016 Document Reviewed: 01/03/2013 Elsevier Interactive Patient Education  2017 [employer].

## 2017-10-05 ENCOUNTER — Telehealth: Payer: Self-pay | Admitting: Emergency Medicine

## 2017-10-05 NOTE — Telephone Encounter (Signed)
Patient's wife per dpr informed of instructions patient is to follow before Nuclear scan: night before test take: only 25 units of tresiba, and only 10 units of Humalog. Day of the test: hold metformin, humalog, and trasiba the day of the procedure until after nuclear scan and after he has ate. She verbally understands.

## 2017-10-07 ENCOUNTER — Other Ambulatory Visit: Payer: Self-pay

## 2017-10-11 NOTE — Addendum Note (Signed)
Addended by: Stevan Born on: 10/11/2017 11:22 AM   Modules accepted: Orders

## 2017-10-12 ENCOUNTER — Other Ambulatory Visit (INDEPENDENT_AMBULATORY_CARE_PROVIDER_SITE_OTHER): Payer: Medicare Other

## 2017-10-12 DIAGNOSIS — E1165 Type 2 diabetes mellitus with hyperglycemia: Secondary | ICD-10-CM | POA: Diagnosis not present

## 2017-10-12 DIAGNOSIS — Z794 Long term (current) use of insulin: Secondary | ICD-10-CM

## 2017-10-12 LAB — BASIC METABOLIC PANEL
BUN: 27 mg/dL — AB (ref 6–23)
CHLORIDE: 104 meq/L (ref 96–112)
CO2: 26 mEq/L (ref 19–32)
CREATININE: 1.19 mg/dL (ref 0.40–1.50)
Calcium: 9.8 mg/dL (ref 8.4–10.5)
GFR: 63.95 mL/min (ref >60.00)
Glucose, Bld: 205 mg/dL — ABNORMAL HIGH (ref 70–99)
Potassium: 4.5 mEq/L (ref 3.5–5.1)
Sodium: 138 mEq/L (ref 135–145)

## 2017-10-13 ENCOUNTER — Telehealth (HOSPITAL_COMMUNITY): Payer: Self-pay | Admitting: *Deleted

## 2017-10-13 LAB — FRUCTOSAMINE: Fructosamine: 320 umol/L — ABNORMAL HIGH (ref 0–285)

## 2017-10-13 NOTE — Telephone Encounter (Signed)
Patient given detailed instructions per Myocardial Perfusion Study Information Sheet for the test on 10/18/17. Patient notified to arrive 15 minutes early and that it is imperative to arrive on time for appointment to keep from having the test rescheduled.  If you need to cancel or reschedule your appointment, please call the office within 24 hours of your appointment. . Patient verbalized understanding. Kirstie Peri

## 2017-10-16 ENCOUNTER — Other Ambulatory Visit: Payer: Self-pay | Admitting: Endocrinology

## 2017-10-16 MED ORDER — CANAGLIFLOZIN-METFORMIN HCL ER 150-500 MG PO TB24: 2.0000 | ORAL_TABLET | Freq: Every day | ORAL | 0 refills | Status: AC

## 2017-10-18 ENCOUNTER — Ambulatory Visit (INDEPENDENT_AMBULATORY_CARE_PROVIDER_SITE_OTHER): Payer: Medicare Other

## 2017-10-18 ENCOUNTER — Other Ambulatory Visit (HOSPITAL_COMMUNITY): Payer: Self-pay

## 2017-10-18 VITALS — Ht 73.0 in | Wt 250.0 lb

## 2017-10-18 DIAGNOSIS — I2510 Atherosclerotic heart disease of native coronary artery without angina pectoris: Secondary | ICD-10-CM | POA: Diagnosis not present

## 2017-10-18 DIAGNOSIS — F331 Major depressive disorder, recurrent, moderate: Secondary | ICD-10-CM

## 2017-10-18 DIAGNOSIS — F411 Generalized anxiety disorder: Secondary | ICD-10-CM

## 2017-10-18 LAB — MYOCARDIAL PERFUSION IMAGING
CHL CUP NUCLEAR SDS: 0
CHL CUP RESTING HR STRESS: 99 {beats}/min
LV dias vol: 67 mL (ref 62–150)
LV sys vol: 26 mL
NUC STRESS TID: 1.12
Peak HR: 101 {beats}/min
SRS: 0
SSS: 0

## 2017-10-18 MED ORDER — TECHNETIUM TC 99M TETROFOSMIN IV KIT
27.5000 | PACK | Freq: Once | INTRAVENOUS | Status: AC | PRN
  Administered 2017-10-18: 27.5 via INTRAVENOUS

## 2017-10-18 MED ORDER — TECHNETIUM TC 99M TETROFOSMIN IV KIT
9.4000 | PACK | Freq: Once | INTRAVENOUS | Status: AC | PRN
  Administered 2017-10-18: 9.4 via INTRAVENOUS

## 2017-10-18 MED ORDER — REGADENOSON 0.4 MG/5ML IV SOLN
0.4000 mg | Freq: Once | INTRAVENOUS | Status: AC
  Administered 2017-10-18: 0.4 mg via INTRAVENOUS

## 2017-10-18 MED ORDER — DULOXETINE HCL 60 MG PO CPEP: 60.0000 mg | ORAL_CAPSULE | Freq: Every day | ORAL | 0 refills | Status: AC

## 2017-10-18 MED ORDER — VENLAFAXINE HCL 75 MG PO TABS: 75.0000 mg | ORAL_TABLET | Freq: Two times a day (BID) | ORAL | 0 refills | Status: DC

## 2017-10-18 NOTE — Progress Notes (Signed)
Patient ID: Raymond Mays, male   DOB: [birth year], 72 y.o.   MRN: [id number]          Reason for Appointment: Follow-up for Type 2 Diabetes   History of Present Illness:          Date of diagnosis of type 2 diabetes mellitus:  2015      Background history:   The patient is a poor historian and is not able to give chronology of his diabetes diagnosis and treatment Records from PCP indicate that he was diagnosed in 2015 but previous treatment is unknown He was apparently taking Actos, Invokamet and Bydureon in 05/2015 when his endocrinologist started him on insulin with a glucose of 326 Subsequently A1c has ranged between 9-10 0.7  Recent history:   INSULIN regimen is: Tresiba 50 units qd. Humalog 20 at dinner    Non-insulin hypoglycemic drugs the patient is taking are:Invokamet XR, 50/500, 2 daily in am, Bydureon 2 mg every Sunday  He has not been seen in follow-up since 12/2016  His A1c is now 8.1, previously 10.4  Current management, blood sugar patterns and problems identified:  He did not bring his blood sugar monitor or keep any record, still using the [employer] brand meter  Also has been checking blood sugars very infrequently at home  Patient is still checking his blood sugar with a [employer] brand meter  On his last visit he was told to increase his Raymond Mays to get his morning sugar back down to target  Also was told to try Ozempic instead of Bydureon but this was not covered by his insurance at that time  His wife says that he is sleeping most of the time and usually skipping breakfast and lunch and generally appears not motivated because of the depression  Blood sugar in the office today was 233 fasting  Also not clear if his blood sugars are high after eating meals  As before has difficulty losing weight        Side effects from medications have been: None  Compliance with the medical regimen: Fair Hypoglycemia:   none  Glucose monitoring:  Not done, rarely  recently        Glucometer: Relion       Blood Glucose readings not available  Self-care: The diet that the patient has been following is: tries to limit Fatty foods, occasionally will have regular soft drinks.      Typical meal intake: Breakfast is none              Dietician visit, most recent: 11/18                Exercise:  Minimal  Weight history:  Wt Readings from Last 3 Encounters:  10/19/17 247 lb (112 kg)  10/18/17 250 lb (113.4 kg)  10/04/17 250 lb (113.4 kg)    Glycemic control:      Lab Results  Component Value Date   HGBA1C 8.1 (A) 10/19/2017   HGBA1C 10.4 (H) 11/16/2016   HGBA1C 10.2 11/10/2016   Lab Results  Component Value Date   MICROALBUR 19.2 (H) 11/10/2016   LDLCALC 67 11/16/2016   CREATININE 1.19 10/12/2017   Lab Results  Component Value Date   MICRALBCREAT 7.2 11/10/2016    Lab Results  Component Value Date   FRUCTOSAMINE 320 (H) 10/12/2017      Allergies as of 10/19/2017      Reactions   Nitroglycerin Itching   "patient went out"   Other  Oak pollen   Percocet [oxycodone-acetaminophen] Itching      Medication List        Accurate as of 10/19/17 10:54 AM. Always use your most recent med list.          allopurinol 300 MG tablet Commonly known as:  ZYLOPRIM Take 300 mg by mouth daily.   Canagliflozin-metFORMIN HCl ER 150-500 MG Tb24 Take 2 tablets by mouth daily.   clopidogrel 75 MG tablet Commonly known as:  PLAVIX Take 1 tablet (75 mg total) by mouth daily.   DULoxetine 60 MG capsule Commonly known as:  CYMBALTA Take 1 capsule (60 mg total) by mouth daily.   Exenatide ER 2 MG Pen Inject into the muscle once a week.   Insulin Degludec 200 UNIT/ML Sopn Inject 50 Units into the skin daily.   insulin lispro 100 UNIT/ML KiwkPen Commonly known as:  HUMALOG INJECT 18 UNITS AT BREAKFAST, 18 UNITS AT LUNCH AND 20 UNITS AT DINNER   losartan 50 MG tablet Commonly known as:  COZAAR Take 50 mg by mouth daily.     PROAIR HFA 108 (90 Base) MCG/ACT inhaler Generic drug:  albuterol Inhale 1-2 puffs into the lungs every 6 (six) hours as needed for wheezing.   rosuvastatin 10 MG tablet Commonly known as:  CRESTOR Take 10 mg by mouth daily.   venlafaxine 75 MG tablet Commonly known as:  EFFEXOR Take 1 tablet (75 mg total) by mouth 2 (two) times daily with a meal.       Allergies:  Allergies  Allergen Reactions  . Nitroglycerin Itching    "patient went out"  . Other     Oak pollen  . Percocet [Oxycodone-Acetaminophen] Itching    Past Medical History:  Diagnosis Date  . Alcohol abuse   . CAD (coronary artery disease)    Stent to circ 2007, patent on cath 2015.  NL EF  . CAD (coronary artery disease)   . Depression   . Diabetes mellitus without complication (Raymond Mays)   . Gout   . History of TIA (transient ischemic attack)   . Hyperlipemia   . Hypertension   . Neuropathy   . Prostate cancer (Raymond Mays)   . Prostate cancer (Raymond Mays)   . Sleep apnea   . Stroke Raymond Mays)     Past Surgical History:  Procedure Laterality Date  . APPENDECTOMY    . CARDIAC CATHETERIZATION    . CHOLECYSTECTOMY    . COLONOSCOPY    . CORONARY ANGIOPLASTY    . heart stent    . KNEE SURGERY     bakers cyst  . PROSTATECTOMY    . TONSILLECTOMY      Family History  Problem Relation Age of Onset  . Heart attack Mother        Died 80s  . Prostate cancer Father   . Diabetes Father   . Suicidality Maternal Grandmother   . Prostate cancer Brother     Social History:  reports that he has never smoked. He has never used smokeless tobacco. He reports that he does not drink alcohol or use drugs.   Review of Systems Lipid history: LDL particle number in 2017 was 720 He has been on Crestor 10 mg daily, LDL below 70  Has previous history of CAD    Lab Results  Component Value Date   CHOL 145 11/16/2016   HDL 39 (L) 11/16/2016   LDLCALC 67 11/16/2016   LDLDIRECT 133.0 11/10/2016   TRIG 193 (H) 11/16/2016  CHOLHDL 3.7 11/16/2016           Hypertension: Is being treated with losartan 50 mg  Most recent eye exam was 1 year ago, reportedly no retinopathy  Most recent foot exam: 10/18 He does see a podiatrist periodically     Physical Examination:  BP 122/82   Pulse 84   Ht 6\' 1"  (1.854 m)   Wt 247 lb (112 kg)   SpO2 98%   BMI 32.59 kg/m        ASSESSMENT:  Diabetes type 2, uncontrolled with obesity   His A1c has been previously on 10% and now 8.1 Also fructosamine of 320 indicates inadequate control although may be improving compared to last year  See history of present illness for detailed discussion of current diabetes management, blood sugar patterns and problems identified  His blood sugars are probably improved from his skipping breakfast and lunch and only eating 1 meal and likely not eating large portions However he still has high fasting reading and needs more basal insulin   He is going to see the dietitian and hopefully may change his diet somewhat to improve his blood sugars and improve his weight Currently not exercising and may not be able to do any in the short-term because of his recent TIA   PLAN:     He will go up to 60 units on the [place] for now  Given Accu-Chek guide meter and he will start checking his blood sugars 2 or 3 times a day at various times as discussed  Will not change his Bydureon but consider Ozempic on the next visit if covered  Hopefully will have better control with treatment of his depression, he is going to be seen by his psychiatrist today  Encouraged him to be more regular with follow-up  Patient Instructions  Take 60 Tresiba  Check blood sugars on waking up  4/7 days  Also check blood sugars about 2 hours after a meal and do this after different meals by rotation  Recommended blood sugar levels on waking up is 90-130 and about 2 hours after meal is 130-160  Please bring your blood sugar monitor to each visit, thank  you        Elayne Snare 10/19/2017, 10:54 AM   Note: This office note was prepared with Dragon voice recognition system technology. Any transcriptional errors that result from this process are unintentional.

## 2017-10-19 ENCOUNTER — Ambulatory Visit: Payer: Medicare Other | Admitting: Endocrinology

## 2017-10-19 ENCOUNTER — Encounter: Payer: Self-pay | Admitting: Endocrinology

## 2017-10-19 ENCOUNTER — Encounter (HOSPITAL_COMMUNITY): Payer: Self-pay | Admitting: Psychiatry

## 2017-10-19 ENCOUNTER — Ambulatory Visit (HOSPITAL_COMMUNITY): Payer: Medicare Other | Admitting: Psychiatry

## 2017-10-19 VITALS — BP 126/80 | HR 80 | Ht 73.0 in | Wt 250.0 lb

## 2017-10-19 VITALS — BP 122/82 | HR 84 | Ht 73.0 in | Wt 247.0 lb

## 2017-10-19 DIAGNOSIS — R454 Irritability and anger: Secondary | ICD-10-CM

## 2017-10-19 DIAGNOSIS — G459 Transient cerebral ischemic attack, unspecified: Secondary | ICD-10-CM | POA: Diagnosis not present

## 2017-10-19 DIAGNOSIS — F411 Generalized anxiety disorder: Secondary | ICD-10-CM | POA: Diagnosis not present

## 2017-10-19 DIAGNOSIS — F331 Major depressive disorder, recurrent, moderate: Secondary | ICD-10-CM

## 2017-10-19 DIAGNOSIS — G4731 Primary central sleep apnea: Secondary | ICD-10-CM | POA: Diagnosis not present

## 2017-10-19 DIAGNOSIS — Z794 Long term (current) use of insulin: Secondary | ICD-10-CM

## 2017-10-19 DIAGNOSIS — Z79899 Other long term (current) drug therapy: Secondary | ICD-10-CM

## 2017-10-19 DIAGNOSIS — G4719 Other hypersomnia: Secondary | ICD-10-CM

## 2017-10-19 DIAGNOSIS — G4739 Other sleep apnea: Secondary | ICD-10-CM

## 2017-10-19 DIAGNOSIS — E1165 Type 2 diabetes mellitus with hyperglycemia: Secondary | ICD-10-CM

## 2017-10-19 DIAGNOSIS — Z639 Problem related to primary support group, unspecified: Secondary | ICD-10-CM

## 2017-10-19 LAB — POCT GLYCOSYLATED HEMOGLOBIN (HGB A1C): Hemoglobin A1C: 8.1 % — AB (ref 4.0–5.6)

## 2017-10-19 LAB — GLUCOSE, POCT (MANUAL RESULT ENTRY): POC Glucose: 233 mg/dl — AB (ref 70–99)

## 2017-10-19 MED ORDER — GLUCOSE BLOOD VI STRP: ORAL_STRIP | 12 refills | Status: DC

## 2017-10-19 MED ORDER — GLUCOSE BLOOD VI STRP: ORAL_STRIP | 12 refills | Status: AC

## 2017-10-19 MED ORDER — BYDUREON BCISE 2 MG/0.85ML ~~LOC~~ AUIJ: 2.0000 mg | AUTO-INJECTOR | SUBCUTANEOUS | 6 refills | Status: AC

## 2017-10-19 MED ORDER — LURASIDONE HCL 40 MG PO TABS: 40.0000 mg | ORAL_TABLET | Freq: Every day | ORAL | Status: AC

## 2017-10-19 NOTE — Patient Instructions (Signed)
   Start Latuda 40 mg at dinner time.     This medication should help treat your depression, decrease your irritablity, make you sleepy enough to sleep with your CPAP.   Please call with questions.

## 2017-10-19 NOTE — Progress Notes (Signed)
BH MD/PA/NP OP Progress Note  10/19/2017 1:43 PM Raymond Mays  MRN:  [id number]  Chief Complaint: "My productive life is over, and I don't see much point to living anymore"   HPI: Raymond Mays is a  72 y.o.  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  He and wife describe significant marital conflict.  They have been married for 10 years, and wife feels like she is "responsible for Kemet's health, and that his son's think that she is not doing enough to take care of their father, but they live in [place] and don't help in his care."  She reports that Lenon is angry with her for "being controlling, and he thinks I am trying to take his driver's license away."  She states that he has kicked her out of the house repeatedly, and even though he has asked her to come back, she does not think he is sorry, and reports that she is "afraid of his anger", but denies that he has ever harmed her or threatened harm.  She currently goes over in the morning and stays there during the day/evening, but will not sleep at home.  Both Skylar and his wife have started therapy.   Tell describes a longstanding depression since adolescence, which has worsened since moving from old home, [place], and friends 1.5 years ago.  He reports that he has continued to be more depressed and irritable over the past 4 years, over which time he spends 12-14 hours in bed.   He states that he gets into bed at 1:30 AM for the night after watching TV, but does not fall asleep until 5 AM, but can only sleep until 6-7 AM.  He then naps off and on throughout the day, stays in bed, but never feels rested.   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  He has been titrated to ASV mode/CPAP and has new masks, but has not worn them because "they are not comfortable and are too loud".   I have made him aware that he  should definitely not be driving with a severe untreated sleep apnea.  He has decreased appetite, and endorses only eating one meal a day.  He has seen a drop in his Hgb A1c from 10 -8 on recent check. He reports anhedonia, hypersomnia with insomnia, decreased concentration, interests, psychomotor retardation, low energy and thoughts of being better off dead.  He does enjoy caring for and spending time with his dogs, and likes that they have a big yard to run in.  He misses his friends from home, however he does not want to go on trips with his wife to see friends and family.  He reports "that I can't bring myself to commit suicide". He does have a gun at home, but does not have access to it, and has thought about trying to suffocate himself with pillows, but can't do it.  He denies other plans or any intent for suicide.  He states that it wouldn't matter if he died from illness, but he does not want to have a debilitating stroke.  Patient has had a TIA and mild memory impairment. He denies any mania, psychosis, or panic attacks. He denies any crying spells or any feeling of hopelessness or worthlessness.  He denies HI, self harm thoughts or AVH.  Patient has been off of Effexor 75  mg BID for > 1 week.  He and his wife report no change in worsening depressed mood, irritability symptoms, energy or focus.  He denies any discontinuation symptoms.  Patient has multiple health issues.  He has completed radiation treatment (March and April) for his prostate cancer.  He admitted there are days when he feels very tired and have sleep issues because of radiation.  He has to go to bathroom in the night.  He had to miss some family trips due to treatment, which made him more depressed.  Patient has no rash, itching, tremors or shakes.  He is taking Cymbalta.  He is not drinking or using other illicit substances.    Visit Diagnosis:    ICD-10-CM   1. MDD (major depressive disorder), recurrent episode, moderate (HCC)  F33.1   2. GAD (generalized anxiety disorder) F41.1   3. Complex sleep apnea syndrome G47.31   4. TIA (transient ischemic attack) G45.9   5. Excessive daytime sleepiness G47.19   6. Family relationship problem Z63.9   7. Anger R45.4   8. Encounter for medication management [phone]     Past Psychiatric History: Reviewed. Patient has depression and he has history of suicidal thoughts but no attempt.  He was seeing psychiatrist at [facility] and prescribed Abilify and Effexor.  Letter his primary care physician discontinued Abilify and tried him on Wellbutrin.  Patient has history of heavy drinking which includes binge but denies any seizures blackouts or any rehabilitation.    Past Medical History:  Past Medical History:  Diagnosis Date  . Alcohol abuse   . CAD (coronary artery disease)    Stent to circ 2007, patent on cath 2015.  NL EF  . CAD (coronary artery disease)   . Depression   . Diabetes mellitus without complication ([place])   . Gout   . History of TIA (transient ischemic attack)   . Hyperlipemia   . Hypertension   . Neuropathy   . Prostate cancer ([place])   . Prostate cancer ([place])   . Sleep apnea   . Stroke [facility])     Past Surgical History:  Procedure Laterality Date  . APPENDECTOMY    . CARDIAC CATHETERIZATION    . CHOLECYSTECTOMY    . COLONOSCOPY    . CORONARY ANGIOPLASTY    . heart stent    . KNEE SURGERY     bakers cyst  . PROSTATECTOMY    . TONSILLECTOMY      Family Psychiatric History: Reviewed.  Family History:  Family History  Problem Relation Age of Onset  . Heart attack Mother        Died 80s  . Prostate cancer Father   . Diabetes Father   . Suicidality Maternal Grandmother   . Prostate cancer Brother     Social History:  Social History   Socioeconomic History  . Marital status: Married    Spouse name: Jeani Hawking  . Number of children: 2  . Years of education: Not on file  . Highest education level: Not on file  Occupational History  . Not  on file  Social Needs  . Financial resource strain: Not hard at all  . Food insecurity:    Worry: Never true    Inability: Never true  . Transportation needs:    Medical: No    Non-medical: No  Tobacco Use  . Smoking status: Never Smoker  . Smokeless tobacco: Never Used  Substance and Sexual Activity  . Alcohol use: No  .  Drug use: No  . Sexual activity: Never  Lifestyle  . Physical activity:    Days per week: 0 days    Minutes per session: 0 min  . Stress: To some extent  Relationships  . Social connections:    Talks on phone: Once a week    Gets together: Once a week    Attends religious service: Never    Active member of club or organization: Yes    Attends meetings of clubs or organizations: Never    Relationship status: Married  Other Topics Concern  . Not on file  Social History Narrative   He is originally from [place]. He lived in [place]. 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    Allergies:  Allergies  Allergen Reactions  . Nitroglycerin Itching    "patient went out"  . Other     Oak pollen  . Percocet [Oxycodone-Acetaminophen] Itching    Metabolic Disorder Labs: Lab Results  Component Value Date   HGBA1C 8.1 (A) 10/19/2017   MPG 251.78 11/16/2016   No results found for: PROLACTIN Lab Results  Component Value Date   CHOL 145 11/16/2016   TRIG 193 (H) 11/16/2016   HDL 39 (L) 11/16/2016   CHOLHDL 3.7 11/16/2016   VLDL 39 11/16/2016   LDLCALC 67 11/16/2016   Lab Results  Component Value Date   TSH 2.734 11/16/2016    Therapeutic Level Labs: No results found for: LITHIUM No results found for: VALPROATE No components found for:  CBMZ  Current Medications: Current Outpatient Medications  Medication Sig Dispense Refill  . allopurinol (ZYLOPRIM) 300 MG tablet Take 300 mg by mouth daily.    Marland Kitchen BYDUREON BCISE 2 MG/0.85ML AUIJ Inject 2 mg into the muscle once a week. 4 pen 6  .  Canagliflozin-metFORMIN HCl ER (INVOKAMET XR) 150-500 MG TB24 Take 2 tablets by mouth daily. 60 tablet 0  . clopidogrel (PLAVIX) 75 MG tablet Take 1 tablet (75 mg total) by mouth daily. 30 tablet 5  . DULoxetine (CYMBALTA) 60 MG capsule Take 1 capsule (60 mg total) by mouth daily. 90 capsule 0  . glucose blood test strip Use as instructed 100 each 12  . Insulin Degludec (TRESIBA FLEXTOUCH) 200 UNIT/ML SOPN Inject 50 Units into the skin daily. 5 pen 3  . insulin lispro (HUMALOG KWIKPEN) 100 UNIT/ML KiwkPen INJECT 18 UNITS AT BREAKFAST, 18 UNITS AT LUNCH AND 20 UNITS AT DINNER 15 pen 3  . losartan (COZAAR) 50 MG tablet Take 50 mg by mouth daily.    . rosuvastatin (CRESTOR) 10 MG tablet Take 10 mg by mouth daily.    Marland Kitchen venlafaxine (EFFEXOR) 75 MG tablet Take 1 tablet (75 mg total) by mouth 2 (two) times daily with a meal. 180 tablet 0  . albuterol (PROAIR HFA) 108 (90 Base) MCG/ACT inhaler Inhale 1-2 puffs into the lungs every 6 (six) hours as needed for wheezing.    . Exenatide ER 2 MG PEN Inject into the muscle once a week.     No current facility-administered medications for this visit.      Musculoskeletal: Strength & Muscle Tone: within normal limits Gait & Station: normal Patient leans: N/A  Psychiatric Specialty Exam: Review of Systems  Constitutional: Positive for malaise/fatigue.  HENT: Negative.   Respiratory: Negative for cough and shortness of breath.        Untreated severe complex sleep apnea with [place] Breathing   Cardiovascular:  Negative for chest pain, palpitations and leg swelling.  Gastrointestinal: Negative.   Genitourinary: Positive for frequency.       Nocturia   Skin: Negative.   Neurological: Positive for weakness. Negative for tingling.  Psychiatric/Behavioral: Positive for depression, memory loss and suicidal ideas (passive, denies intent). Negative for hallucinations and substance abuse. The patient is nervous/anxious and has insomnia.     Blood  pressure 126/80, pulse 80, height 6\' 1"  (1.854 m), weight 250 lb (113.4 kg).Body mass index is 32.98 kg/m.  General Appearance: Casual and Superficially cooperative.  Poor historian.  Eye Contact:  Fair  Speech:  Clear and Coherent and Normal Rate  Volume:  Normal  Mood:  Angry, Depressed and Irritable  Affect:  Constricted  Thought Process:  Coherent and Linear  Orientation:  Full (Time, Place, and Person)  Thought Content: Hallucinations: None and Rumination   Suicidal Thoughts:  passive, with no intent  Homicidal Thoughts:  No  Memory:  Immediate;   Fair Recent;   Fair Remote;   Fair  Judgement:  Fair  Insight:  Shallow  Psychomotor Activity:  Decreased  Concentration:  Concentration: Fair and Attention Span: Fair  Recall:  [employer] of Knowledge: Fair  Language: Fair  Akathisia:  No  Handed:  Right  AIMS (if indicated): not done  Assets:  Communication Skills Desire for Improvement Housing Resilience Social Support  ADL's:  Intact  Cognition: Impaired,  Mild  Sleep:  Improved.   Screenings: PHQ2-9     CONSULT from 03/23/2017 in [facility] Radiation Oncology Nutrition from 12/13/2016 in [facility]  PHQ-2 Total Score  0  6  PHQ-9 Total Score  -  15       Assessment and Plan: Depressive disorder, recurrent.  Anxiety disorder NOS. Anger. Family relationship problem. Complex sleep apnea with EDS. Hx of TIA  Patient and wife present with increased conflict related to worsening depression, irritability and anger.  He has been off Effexor without any further deterioration of symptoms. Cymbalta has been reported as being helpful. Patient has no tremors, serotonin syndrome or any rash.   Reviewed sleep study with patient and wife extensively related to his symptoms of sleepiness, depression, decreased concentration and irritability as well as neurologic and cardiac risk of untreated sleep apnea.  Discussed with patient that he can  not drive with untreated sleep apnea, and discussed desensitization techniques to enhance compliance. He should follow-up with sleep specialist regarding compliance and ongoing care.  Encouraged them to remain in individual as well as couples therapy.   Patient Instructions    Start Latuda 40 mg at dinner time.     This medication should help treat your depression, decrease your irritablity, make you sleepy enough to sleep with your CPAP.   Please call with questions.     Latuda samples provided (20 mg and 40 mg)  Discussed medication side effects and benefits:  Least metabolic effects while providing some sedation and depression augmentation.  Discussed possibility of titration of medication as indicated.  Recommended to call us back if he has any question, concern if he feels worsening of the symptoms.   Time spent 60 minutes.  More than 50% of the time spent in medication education, psychoeducation, counseling and coordination of care.  Discuss safety plan that anytime having active suicidal thoughts or homicidal thoughts then patient need to call 911 or go to the local emergency room.   Follow-up in 4 weeks   [place]  Donny Pique, MD 10/19/2017, 1:43 PM

## 2017-10-19 NOTE — Patient Instructions (Addendum)
Take 41 Tresiba for now  Check blood sugars on waking up  4/7 days  Also check blood sugars about 2 hours after a meal and do this after different meals by rotation  Recommended blood sugar levels on waking up is 80-130 and about 2 hours after meal is 130-160  Please bring your blood sugar monitor to each visit, thank you

## 2017-10-25 ENCOUNTER — Telehealth: Payer: Self-pay

## 2017-10-25 NOTE — Telephone Encounter (Signed)
FYI  Received fax that patient is no longer a patient of Dr. Sandi Mariscal and they advised that we stop sending the office visits to them.

## 2017-11-07 ENCOUNTER — Ambulatory Visit: Payer: Medicare Other | Admitting: Cardiology

## 2017-11-07 NOTE — Progress Notes (Deleted)
Cardiology Office Note:    Date:  11/07/2017   ID:  Raymond Mays, DOB [birth year], MRN [id number]  PCP:  Townsend Roger, MD  Cardiologist:  Shirlee More, MD    Referring MD: Nona Dell, Corene Cornea, MD    ASSESSMENT:    No diagnosis found. PLAN:    In order of problems listed above:  1. ***   Next appointment: ***   Medication Adjustments/Labs and Tests Ordered: Current medicines are reviewed at length with the patient today.  Concerns regarding medicines are outlined above.  No orders of the defined types were placed in this encounter.  No orders of the defined types were placed in this encounter.   No chief complaint on file.   History of Present Illness:    Raymond Mays is a 72 y.o. male with a hx of CAD with a history of T2 DM hypertension TIA, sleep apnea and hyperlipidemia seen by Dr Percival Spanish on 01/07/17 at the request of Townsend Roger, MD. He has a history of stent placement to the circumflex in 2007.  He was treated in [facility]. Last catheterization was 2015. At that time he had 30% proximal diffuse LAD stenosis, patent stent in circumflex, 50% obtuse marginal stenosis. he was last seen 10/04/17. Compliance with diet, lifestyle and medications: *** Past Medical History:  Diagnosis Date  . Alcohol abuse   . CAD (coronary artery disease)    Stent to circ 2007, patent on cath 2015.  NL EF  . CAD (coronary artery disease)   . Depression   . Diabetes mellitus without complication ([name])   . Gout   . History of TIA (transient ischemic attack)   . Hyperlipemia   . Hypertension   . Neuropathy   . Prostate cancer ([place])   . Prostate cancer ([place])   . Sleep apnea   . Stroke [facility])     Past Surgical History:  Procedure Laterality Date  . APPENDECTOMY    . CARDIAC CATHETERIZATION    . CHOLECYSTECTOMY    . COLONOSCOPY    . CORONARY ANGIOPLASTY    . heart stent    . KNEE SURGERY     bakers cyst  . PROSTATECTOMY    . TONSILLECTOMY      Current  Medications: No outpatient medications have been marked as taking for the 11/07/17 encounter (Appointment) with Richardo Priest, MD.   Current Facility-Administered Medications for the 11/07/17 encounter (Appointment) with Richardo Priest, MD  Medication  . lurasidone (LATUDA) tablet 40 mg     Allergies:   Nitroglycerin; Other; and Percocet [oxycodone-acetaminophen]   Social History   Socioeconomic History  . Marital status: Married    Spouse name: Jeani Hawking  . Number of children: 2  . Years of education: Not on file  . Highest education level: Not on file  Occupational History  . Not on file  Social Needs  . Financial resource strain: Not hard at all  . Food insecurity:    Worry: Never true    Inability: Never true  . Transportation needs:    Medical: No    Non-medical: No  Tobacco Use  . Smoking status: Never Smoker  . Smokeless tobacco: Never Used  Substance and Sexual Activity  . Alcohol use: No  . Drug use: No  . Sexual activity: Never  Lifestyle  . Physical activity:    Days per week: 0 days    Minutes per session: 0 min  . Stress: To some extent  Relationships  . Social connections:    Talks on phone: Once a week    Gets together: Once a week    Attends religious service: Never    Active member of club or organization: Yes    Attends meetings of clubs or organizations: Never    Relationship status: Married  Other Topics Concern  . Not on file  Social History Narrative   He is originally from [place]. He lived in [place]. 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     Family History: The patient's ***family history includes Diabetes in his father; Heart attack in his mother; Prostate cancer in his brother and father; Suicidality in his maternal grandmother. ROS:   Please see the history of present illness.    All other systems reviewed and are negative.  EKGs/Labs/Other Studies Reviewed:    The  following studies were reviewed today:  EKG:  EKG ordered today.  The ekg ordered today demonstrates *** lexiscan MPI 10/18/17 Normal perfusion, EF 62% Recent Labs: 11/15/2016: ALT 23 11/16/2016: TSH 2.734 11/17/2016: Hemoglobin 14.3; Platelets 183 10/12/2017: BUN 27; Creatinine, Ser 1.19; Potassium 4.5; Sodium 138  Recent Lipid Panel    Component Value Date/Time   CHOL 145 11/16/2016 0422   TRIG 193 (H) 11/16/2016 0422   HDL 39 (L) 11/16/2016 0422   CHOLHDL 3.7 11/16/2016 0422   VLDL 39 11/16/2016 0422   LDLCALC 67 11/16/2016 0422   LDLDIRECT 133.0 11/10/2016 1449    Physical Exam:    VS:  There were no vitals taken for this visit.    Wt Readings from Last 3 Encounters:  10/19/17 247 lb (112 kg)  10/18/17 250 lb (113.4 kg)  10/04/17 250 lb (113.4 kg)     GEN: *** Well nourished, well developed in no acute distress HEENT: Normal NECK: No JVD; No carotid bruits LYMPHATICS: No lymphadenopathy CARDIAC: ***RRR, no murmurs, rubs, gallops RESPIRATORY:  Clear to auscultation without rales, wheezing or rhonchi  ABDOMEN: Soft, non-tender, non-distended MUSCULOSKELETAL:  No edema; No deformity  SKIN: Warm and dry NEUROLOGIC:  Alert and oriented x 3 PSYCHIATRIC:  Normal affect    Signed, Shirlee More, MD  11/07/2017 7:34 AM    [facility]

## 2017-11-18 ENCOUNTER — Ambulatory Visit (HOSPITAL_COMMUNITY): Payer: Self-pay | Admitting: Psychiatry

## 2017-11-22 ENCOUNTER — Ambulatory Visit: Payer: Medicare Other | Admitting: Neurology

## 2017-11-22 ENCOUNTER — Encounter

## 2017-11-23 ENCOUNTER — Ambulatory Visit: Payer: Medicare Other | Admitting: Sports Medicine

## 2017-12-05 ENCOUNTER — Other Ambulatory Visit: Payer: Self-pay | Admitting: Endocrinology

## 2017-12-20 ENCOUNTER — Ambulatory Visit: Payer: Self-pay | Admitting: Endocrinology

## 2017-12-20 DIAGNOSIS — Z0289 Encounter for other administrative examinations: Secondary | ICD-10-CM

## 2017-12-30 IMAGING — MR MR MRA NECK WO/W CM
12 of 20 series · 22 of 48 positions shown · IV contrast (Yes MH)
Comparison: CT head without contrast 11/15/2016

CLINICAL DATA: Intermittent binocular diplopia

EXAM:
MRI HEAD WITHOUT CONTRAST
MRA HEAD WITHOUT CONTRAST
MRA NECK WITHOUT AND WITH CONTRAST
TECHNIQUE: Multiplanar, multiecho pulse sequences of the brain and surrounding
structures were obtained without intravenous contrast. Angiographic
images of the Circle of Willis were obtained using MRA technique
without intravenous contrast. Angiographic images of the neck were
obtained using MRA technique without and with intravenous contrast.
Carotid stenosis measurements (when applicable) are obtained
utilizing NASCET criteria, using the distal internal carotid
diameter as the denominator.
CONTRAST:  20mL MULTIHANCE GADOBENATE DIMEGLUMINE 529 MG/ML IV SOLN

[Series 3: DWI · axial · 3.0mm · 0.94mm/px · 1 of 99 slices shown (1 of 2)]
[im 1/99]
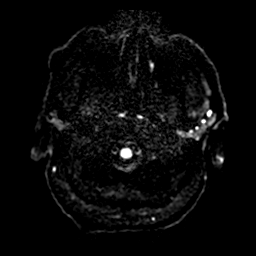

[Series 4: ax (id) 2 · axial · 1.0mm · 0.43mm/px · z∈[-49,+42]mm · 4 of 184 slices shown]
[im 1/184]
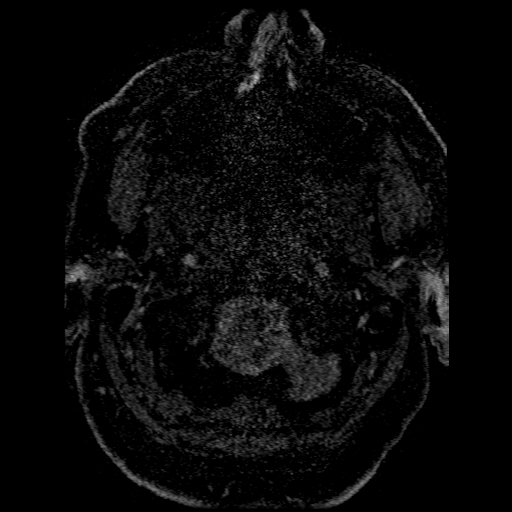
[im 62/184]
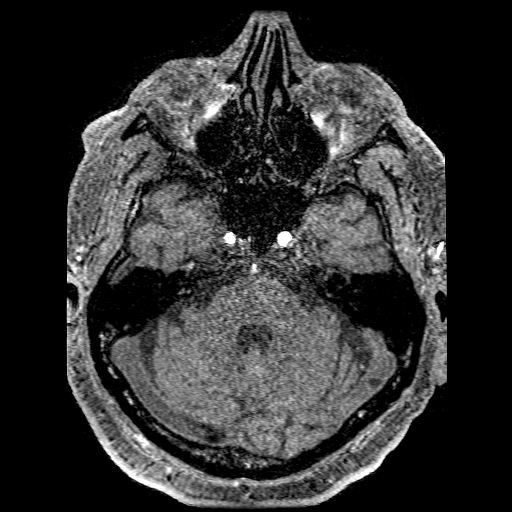
[im 123/184]
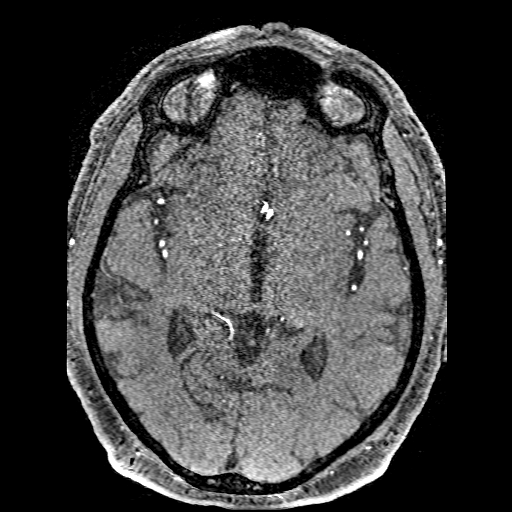
[im 184/184]
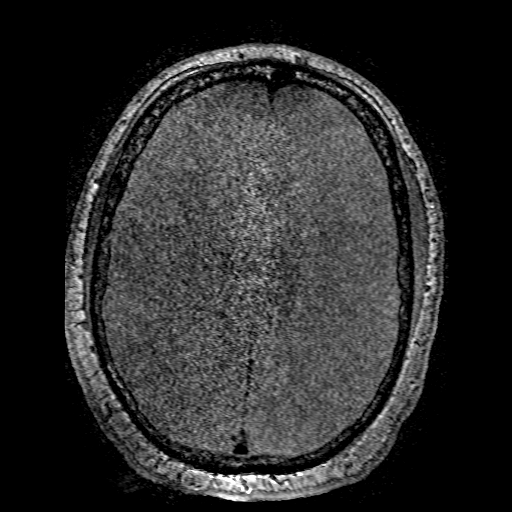

[Series 5: DWI · coronal · 4.0mm · 0.94mm/px · 2 of 72 slices shown (2 of 2)]
[im 1/72]
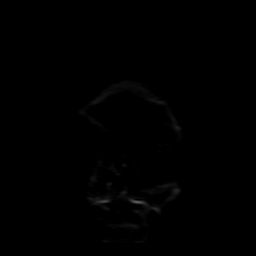
[im 72/72]
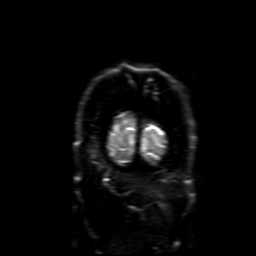

[Series 6: FLAIR · sagittal · 5.0mm · 0.47mm/px · 1 of 25 slices shown (1 of 2)]
[im 1/25]
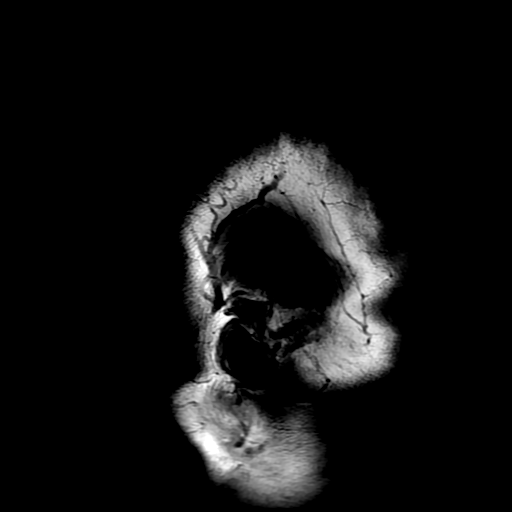

[Series 7: T2 · axial · 5.0mm · 0.47mm/px · 1 of 29 slices shown (1 of 2)]
[im 1/29]
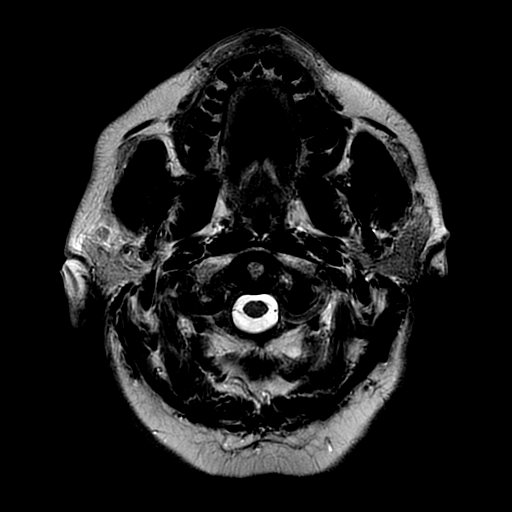

[Series 8: FLAIR · axial · 5.0mm · 0.47mm/px · 1 of 29 slices shown (2 of 2)]
[im 1/29]
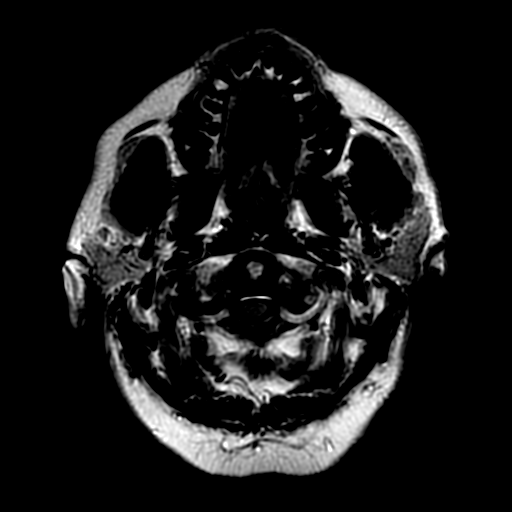

[Series 9: (person_name) · axial · 3.0mm · 0.47mm/px · z∈[-55,+99]mm · 3 of 104 slices shown]
[im 1/104]
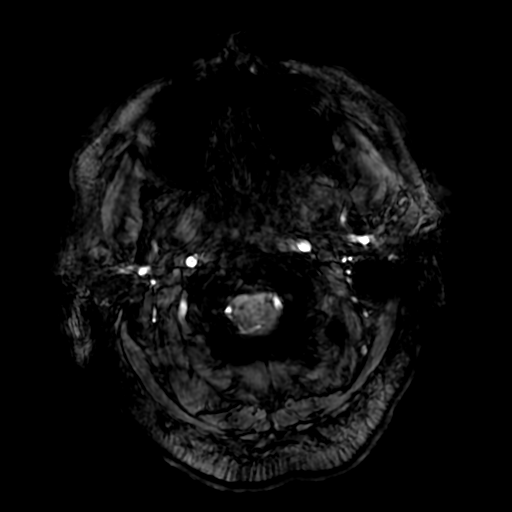
[im 52/104]
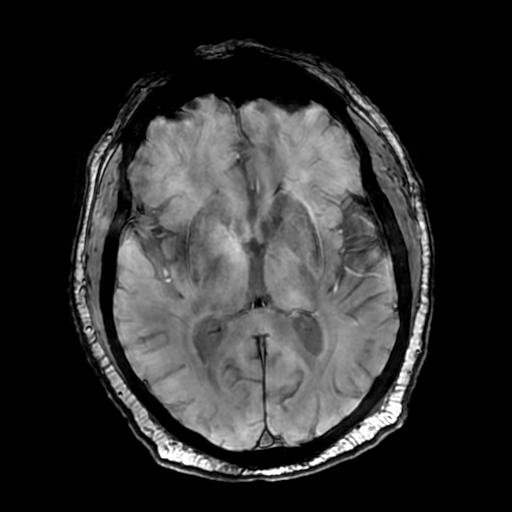
[im 104/104]
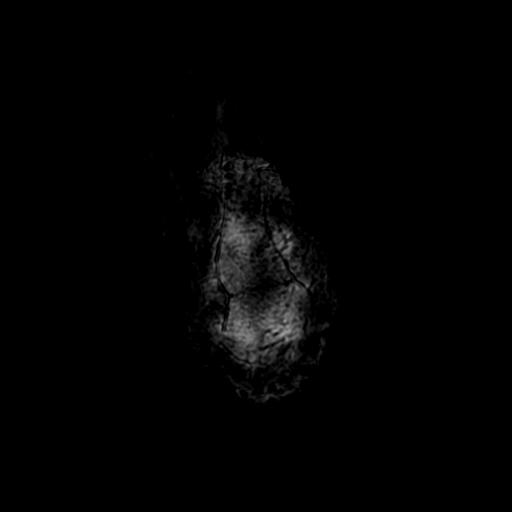

[Series 10: ax 3(person_name) · axial · 3.0mm · 0.94mm/px · 1 of 50 slices shown]
[im 1/50]
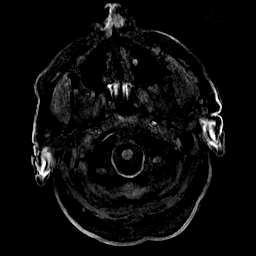

[Series 11: T2 · coronal · 5.0mm · 0.47mm/px · 1 of 31 slices shown (2 of 2)]
[im 1/31]
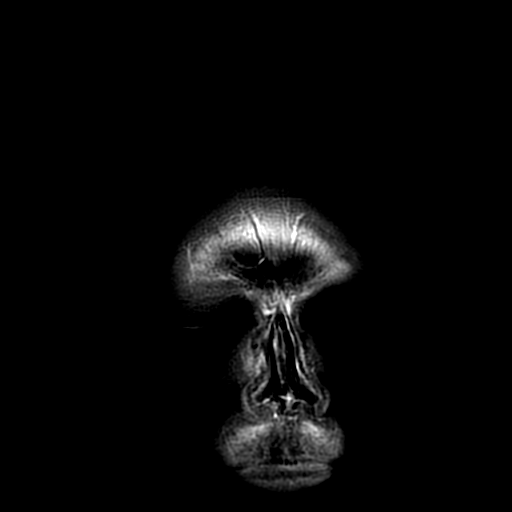

[Series 14: sag inhance (id) · sagittal · 1.2mm · 0.47mm/px · 5 of 424 slices shown]
[im 1/424]
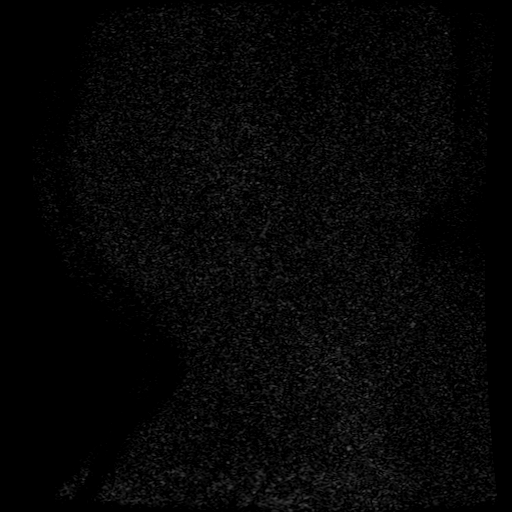
[im 48/424]
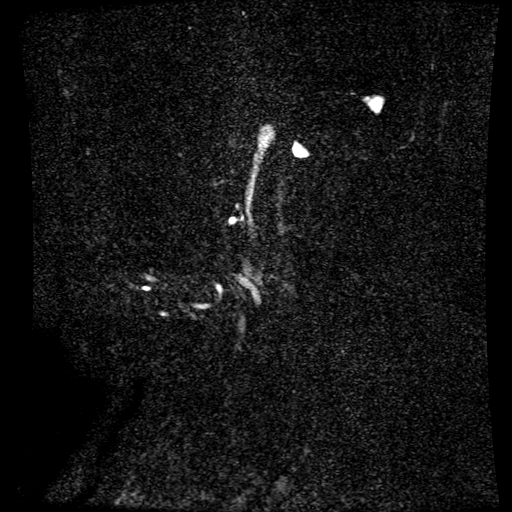
[im 142/424]
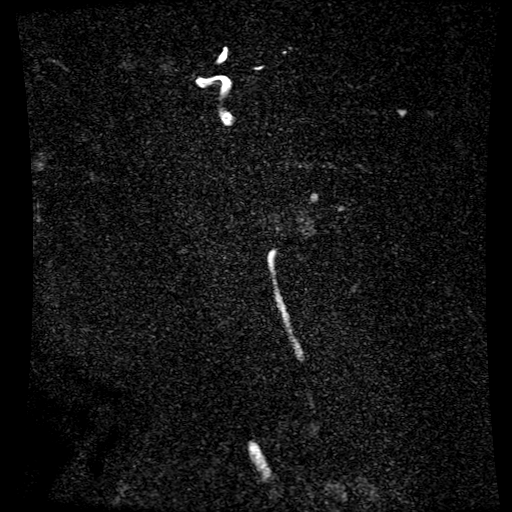
[im 189/424]
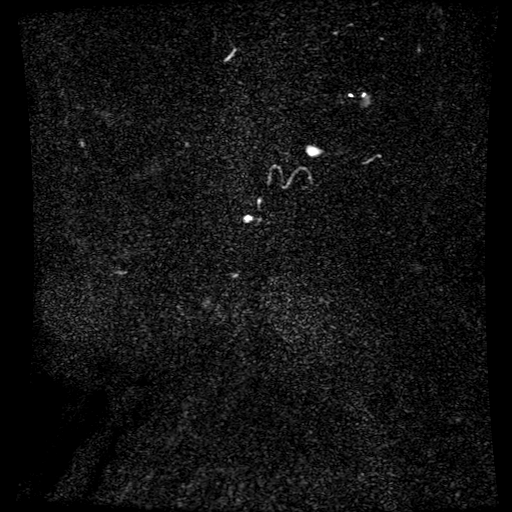
[im 236/424]
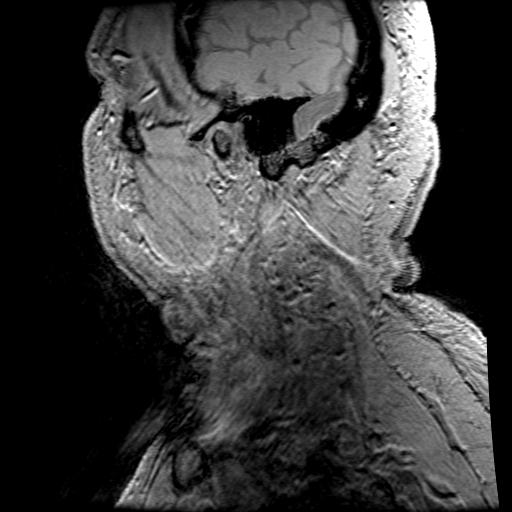

[Series 350: ADC · axial · 3.0mm · 0.94mm/px · 1 of 49 slices shown (1 of 2)]
[im 1/49]
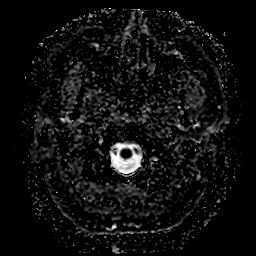

[Series 550: ADC · coronal · 4.0mm · 0.94mm/px · 1 of 36 slices shown (2 of 2)]
[im 1/36]
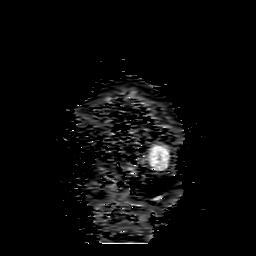

[22 of 48 positions shown; findings below may reference images not displayed]

FINDINGS: MRI HEAD FINDINGS

Brain: Mild periventricular and subcortical white matter changes are
present bilaterally. No acute infarct, hemorrhage, or mass lesion is
present. The ventricles are of normal size. No significant
extra-axial fluid collection is present. The internal auditory
canals are within normal limits bilaterally. The brainstem and
cerebellum are normal.

Vascular: Flow is present in the major intracranial arteries.

Skull and upper cervical spine: Skullbase is within normal limits.
Midline sagittal structures are unremarkable. Craniocervical
junction is normal. Marrow signal is within normal limits.

Sinuses/Orbits: The paranasal sinuses and mastoid air cells are
clear. Bilateral globes and orbits are within normal limits.

MRA HEAD FINDINGS

Focal signal loss is present in the mid basilar artery suggesting a
high-grade focal stenosis or embolus. There is flow in the distal
basilar artery. Both posterior cerebral arteries originate from
basilar tip. PCA branch vessels are unremarkable.

The internal carotid artery is are within normal limits from the
skullbase through the ICA terminus. The left A1 segment is dominant.
The anterior communicating artery is patent. ACA and MCA branch
vessels are within normal limits bilaterally.

MRA NECK FINDINGS

Time-of-flight and enhance sequences demonstrate no significant flow
disturbance at either carotid bifurcation. Flow is antegrade within
the vertebral artery is bilaterally.

A 3 vessel arch configuration is present. Scratched at there is a
common origin of the left common carotid artery in the innominate
artery.

The right common carotid artery is within normal limits. Bifurcation
is unremarkable. Cervical right ICA is normal.

The left common carotid artery is within normal limits. The left
carotid bifurcation is normal. The cervical left ICA is normal.

The vertebral arteries are codominant. There is mild narrowing just
proximal to the vertebrobasilar junction. High-grade stenosis of the
mid basilar artery is confirmed.
IMPRESSION: 1. High-grade stenosis of the mid basilar artery with evidence for
distal flow.
2. No acute infarct or ischemia.
3. Mild periventricular and subcortical white matter changes
bilaterally likely reflect the sequela of chronic microvascular
ischemia.

## 2018-09-21 ENCOUNTER — Other Ambulatory Visit: Payer: Self-pay | Admitting: Endocrinology

## 2018-09-29 ENCOUNTER — Other Ambulatory Visit: Payer: Self-pay | Admitting: Endocrinology

## 2018-09-29 ENCOUNTER — Telehealth: Payer: Self-pay | Admitting: Endocrinology

## 2018-09-29 NOTE — Telephone Encounter (Signed)
Refill or deny? 

## 2018-09-29 NOTE — Telephone Encounter (Signed)
Has been denied, needs to make an appointment for follow-up

## 2018-09-30 ENCOUNTER — Other Ambulatory Visit: Payer: Self-pay | Admitting: Endocrinology

## 2018-10-02 ENCOUNTER — Other Ambulatory Visit: Payer: Self-pay | Admitting: Endocrinology

## 2018-10-02 NOTE — Telephone Encounter (Signed)
ATC to Schedule  °

## 2018-10-03 NOTE — Telephone Encounter (Signed)
ATC to Schedule x 2

## 2018-10-04 ENCOUNTER — Encounter: Payer: Self-pay | Admitting: Endocrinology

## 2018-10-04 NOTE — Telephone Encounter (Signed)
ATC to Schedule x 3  Home number on file was incorrect, ATC daughter for correct phone number  Mailing Letter also.

## 2018-10-04 NOTE — Telephone Encounter (Signed)
Noted  

## 2023-07-10 DEATH — deceased
